# Patient Record
Sex: Male | Born: 1945 | Race: White | Hispanic: No | Smoking: Former smoker
Health system: Southern US, Community
[De-identification: ages and names within clinical notes are randomized; demographics above are authoritative.]

## PROBLEM LIST (undated history)

## (undated) DIAGNOSIS — N189 Chronic kidney disease, unspecified: Secondary | ICD-10-CM

## (undated) DIAGNOSIS — F419 Anxiety disorder, unspecified: Secondary | ICD-10-CM

## (undated) DIAGNOSIS — J449 Chronic obstructive pulmonary disease, unspecified: Secondary | ICD-10-CM

## (undated) DIAGNOSIS — I739 Peripheral vascular disease, unspecified: Secondary | ICD-10-CM

## (undated) DIAGNOSIS — E46 Unspecified protein-calorie malnutrition: Secondary | ICD-10-CM

## (undated) DIAGNOSIS — I809 Phlebitis and thrombophlebitis of unspecified site: Secondary | ICD-10-CM

## (undated) DIAGNOSIS — I10 Essential (primary) hypertension: Secondary | ICD-10-CM

## (undated) DIAGNOSIS — C801 Malignant (primary) neoplasm, unspecified: Secondary | ICD-10-CM

## (undated) DIAGNOSIS — E785 Hyperlipidemia, unspecified: Secondary | ICD-10-CM

## (undated) DIAGNOSIS — I350 Nonrheumatic aortic (valve) stenosis: Secondary | ICD-10-CM

## (undated) DIAGNOSIS — F99 Mental disorder, not otherwise specified: Secondary | ICD-10-CM

## (undated) HISTORY — PX: THYROIDECTOMY: SHX17

## (undated) HISTORY — PX: OTHER SURGICAL HISTORY: SHX169

## (undated) HISTORY — PX: TOTAL LARYNGECTOMY: SHX2543

## (undated) HISTORY — PX: HEMORROIDECTOMY: SUR656

## (undated) HISTORY — PX: LYMPH NODE BIOPSY: SHX201

---

## 2012-09-04 HISTORY — PX: NECK DISSECTION: SUR422

## 2012-09-21 ENCOUNTER — Inpatient Hospital Stay
Admission: AD | Admit: 2012-09-21 | Discharge: 2012-09-27 | Disposition: A | Discharge status: Other Acute Inpatient Hospital | Payer: Medicare Other | Source: Ambulatory Visit | Attending: Internal Medicine | Admitting: Internal Medicine

## 2012-09-21 ENCOUNTER — Other Ambulatory Visit (HOSPITAL_COMMUNITY): Payer: Medicare Other

## 2012-09-21 DIAGNOSIS — J9501 Hemorrhage from tracheostomy stoma: Secondary | ICD-10-CM

## 2012-09-21 DIAGNOSIS — J9601 Acute respiratory failure with hypoxia: Secondary | ICD-10-CM

## 2012-09-21 DIAGNOSIS — J441 Chronic obstructive pulmonary disease with (acute) exacerbation: Secondary | ICD-10-CM

## 2012-09-21 DIAGNOSIS — Z930 Tracheostomy status: Secondary | ICD-10-CM

## 2012-09-21 DIAGNOSIS — J40 Bronchitis, not specified as acute or chronic: Secondary | ICD-10-CM

## 2012-09-21 DIAGNOSIS — I469 Cardiac arrest, cause unspecified: Secondary | ICD-10-CM

## 2012-09-21 MED ORDER — IOHEXOL 300 MG/ML  SOLN
50.0000 mL | Freq: Once | INTRAMUSCULAR | Status: AC | PRN
  Administered 2012-09-21: 50 mL via ORAL

## 2012-09-22 ENCOUNTER — Other Ambulatory Visit (HOSPITAL_COMMUNITY): Payer: Medicare Other

## 2012-09-22 LAB — COMPREHENSIVE METABOLIC PANEL
ALT: 20 U/L (ref 0–53)
Alkaline Phosphatase: 48 U/L (ref 39–117)
Creatinine, Ser: 0.78 mg/dL (ref 0.50–1.35)
GFR calc Af Amer: >90 mL/min (ref >90)
GFR calc non Af Amer: >90 mL/min (ref >90)
Total Bilirubin: 0.4 mg/dL (ref 0.3–1.2)
Total Protein: 5.1 g/dL — ABNORMAL LOW (ref 6.0–8.3)

## 2012-09-22 LAB — CBC
MCH: 31.1 pg (ref 26.0–34.0)
MCV: 91.2 fL (ref 78.0–100.0)
Platelets: 253 10*3/uL (ref 150–400)
RDW: 15 % (ref 11.5–15.5)

## 2012-09-22 LAB — MAGNESIUM: Magnesium: 1.8 mg/dL (ref 1.5–2.5)

## 2012-09-23 LAB — BLOOD GAS, ARTERIAL
Bicarbonate: 23.6 mEq/L (ref 20.0–24.0)
O2 Saturation: 94.5 %
Patient temperature: 98.6
pH, Arterial: 7.495 — ABNORMAL HIGH (ref 7.350–7.450)

## 2012-09-24 DIAGNOSIS — J441 Chronic obstructive pulmonary disease with (acute) exacerbation: Secondary | ICD-10-CM

## 2012-09-24 DIAGNOSIS — J40 Bronchitis, not specified as acute or chronic: Secondary | ICD-10-CM

## 2012-09-24 DIAGNOSIS — Z930 Tracheostomy status: Secondary | ICD-10-CM

## 2012-09-24 NOTE — Consult Note (Signed)
PULMONARY  / CRITICAL CARE MEDICINE  Name: Jason Branch MRN: [id number] DOB: 06/15/1945    ADMISSION DATE:  09/21/2012 CONSULTATION DATE:  09/24/2012  REFERRING MD :  Annabelle Harman PRIMARY SERVICE:  [facility]  CHIEF COMPLAINT:  Tracheostomy management.  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  Patient arrived with a nasal trumpet in his stoma.  PCCM called to evaluate airway.  SIGNIFICANT EVENTS / STUDIES:  Laryngectomy recently.  LINES / TUBES: Trach stoma with a nasal trumpet within it.  CULTURES: Sputum 8/25>>>  ANTIBIOTICS: Cefepime 8/25>>> Vancomycin 8/25>>>  PAST MEDICAL HISTORY :  No past medical history on file. reviewed No past surgical history on file. reviewed Prior to Admission medications   Not on File  Reviewed Allergies not on file Reviewed FAMILY HISTORY:  No family history on file.Reviewed SOCIAL HISTORY:  has no tobacco, alcohol, and drug history on file.reviewed.  REVIEW OF SYSTEMS:   Trached, unable to answer questions.  SUBJECTIVE: Comfortable.  VITAL SIGNS:  Reviewed.  PHYSICAL EXAMINATION: General:  Chronically ill appearing male with large stoma in the anterior neck and significant foul smelling tracheal secretion from stoma. Neuro:  Alert and interactive. HEENT:  [place]/AT, PERRL, EOM-I and MMM. Neck:  Supple, -LAN, -JVD and stoma noted with increased secretion. Cardiovascular:  RRR, Nl S1/S2, -M/R/G. Lungs:  Coarse BS diffusely. Abdomen:  Soft, NT, ND and +BS. Musculoskeletal:  -edema and -tenderness. Skin:  Thin but intact.  Recent Labs Lab 09/22/12 0535  NA 137  K 3.5  CL 104  CO2 26  BUN 11  CREATININE 0.78  GLUCOSE 114*   Recent Labs Lab 09/22/12 0535  HGB 11.7*  HCT 34.3*  WBC 6.8  PLT 253   No results found.  ASSESSMENT / PLAN:  67 year old male s/p laryngectomy with a large stoma with a nasal trumpet rising from it trach site.   OR notes were reviewed, there is a mention of a "voice box" attached.  The patient is on TC with purulent secretion from site.  Purulent bronchitis. Tracheostomy as noted above. COPD.  Recommendations: - Cultures. - Vanc/cefepime as ordered. - Bronchodilator. - Recommend consult with ENT to evaluate the airway since patient will need a more permanent solution to tracheostomy. - Will follow up with you.  Alyson Reedy, M.D. Pulmonary and Critical Care Medicine [facility] Pager: [phone]  09/24/2012, 3:39 PM

## 2012-09-25 ENCOUNTER — Other Ambulatory Visit (HOSPITAL_COMMUNITY): Payer: Medicare Other

## 2012-09-25 LAB — CULTURE, RESPIRATORY W GRAM STAIN

## 2012-09-25 MED ORDER — IOHEXOL 300 MG/ML  SOLN
80.0000 mL | Freq: Once | INTRAMUSCULAR | Status: AC | PRN
  Administered 2012-09-25: 80 mL via INTRAVENOUS

## 2012-09-26 DIAGNOSIS — J441 Chronic obstructive pulmonary disease with (acute) exacerbation: Secondary | ICD-10-CM

## 2012-09-26 DIAGNOSIS — Z930 Tracheostomy status: Secondary | ICD-10-CM

## 2012-09-26 DIAGNOSIS — J40 Bronchitis, not specified as acute or chronic: Secondary | ICD-10-CM

## 2012-09-26 NOTE — Progress Notes (Signed)
PULMONARY  / CRITICAL CARE MEDICINE  Name: Jason Branch MRN: [id number] DOB: 25-Sep-1945    ADMISSION DATE:  09/21/2012 CONSULTATION DATE:  09/24/2012  REFERRING MD :  Annabelle Harman PRIMARY SERVICE:  [facility]  CHIEF COMPLAINT:  Tracheostomy management.  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  Patient arrived with a nasal trumpet in his stoma.  PCCM called to evaluate airway.  SIGNIFICANT EVENTS / STUDIES:  Laryngectomy recently.  LINES / TUBES: Trach stoma with a nasal trumpet within it.  CULTURES: Sputum 8/25>>>Psuedomonas in sputum, sensitive to cefepime.  ANTIBIOTICS: Cefepime 8/25>>> Vancomycin 8/25>>>  SUBJECTIVE: Comfortable.  VITAL SIGNS:  Reviewed.  PHYSICAL EXAMINATION: General:  Chronically ill appearing male with large stoma in the anterior neck and significant foul smelling tracheal secretion from stoma. Neuro:  Alert and interactive. HEENT:  [place]/AT, PERRL, EOM-I and MMM. Neck:  Supple, -LAN, -JVD and stoma noted with increased secretion. Cardiovascular:  RRR, Nl S1/S2, -M/R/G. Lungs:  Coarse BS diffusely. Abdomen:  Soft, NT, ND and +BS. Musculoskeletal:  -edema and -tenderness. Skin:  Thin but intact.  Recent Labs Lab 09/22/12 0535  NA 137  K 3.5  CL 104  CO2 26  BUN 11  CREATININE 0.78  GLUCOSE 114*    Recent Labs Lab 09/22/12 0535  HGB 11.7*  HCT 34.3*  WBC 6.8  PLT 253   Ct Soft Tissue Neck W Contrast  09/26/2012   *RADIOLOGY REPORT*  Clinical Data: Evaluate for abscess.  History of tracheostomy.  CT NECK WITH CONTRAST  Technique:  Multidetector CT imaging of the neck was performed with intravenous contrast.  Contrast: 80mL OMNIPAQUE IOHEXOL 300 MG/ML  SOLN  Comparison: None.  Findings: A tracheostomy has been performed.  It is widely patent. A nasal trumpet is in the stoma anteriorly.  There is mild surrounding subcutaneous air which extends into the  prevertebral soft tissues. The patient appears to have undergone total laryngectomy.  There is no visible laryngeal skeleton remaining. The hyoid bone is present.  There is a large soft tissue mass in the infrahyoid space to the left of midline measuring 70 x 43 x 67 mm (image 58 series 2 for instance). It is hard to imagine that this represents tumor given that there has been laryngectomy.  This is more likely to represent a hematoma or phlegmon.  Mild surrounding cellulitic change.  No central liquefaction to definitely suggest abscess.  If there is concern for infected hematoma, tissue sampling could be performed at any of multiple levels.  IMPRESSION: Large soft tissue mass to the left of midline in the infrahyoid space measuring 70 x 43 x 67 mm.  In this patient with tracheostomy and total laryngectomy, this likely represents a hematoma or phlegmon.  Residual tumor seems unlikely.  No central liquefaction to suggest abscess.  Tracheostomy with nasal trumpet in the stoma.  No evidence for airway obstruction.   Original Report Authenticated By: Davonna Belling, M.D.    ASSESSMENT / PLAN:  67 year old male s/p laryngectomy with a large stoma with a nasal trumpet rising from it trach site.  OR notes were reviewed, there is a mention of a "voice box" attached.  The patient is on TC with purulent secretion from site.  Purulent bronchitis with pseudomonas. Tracheostomy as noted above. COPD.  Recommendations: - Cultures noted as above, agree with cefepime. - Vanc/cefepime as ordered. - Bronchodilator. - ENT evaluation pending, neck CT ordered and pending. -  Will follow up with you.  Alyson Reedy, M.D. Pulmonary and Critical Care Medicine [facility] Pager: [phone]  09/26/2012, 11:32 AM

## 2012-09-27 ENCOUNTER — Encounter (HOSPITAL_COMMUNITY): Payer: Self-pay | Admitting: Anesthesiology

## 2012-09-27 ENCOUNTER — Encounter (HOSPITAL_COMMUNITY): Payer: Self-pay | Admitting: General Practice

## 2012-09-27 ENCOUNTER — Inpatient Hospital Stay (HOSPITAL_COMMUNITY)
Admission: AD | Admit: 2012-09-27 | Discharge: 2012-09-28 | DRG: 252 | Disposition: A | Discharge status: Other Acute Inpatient Hospital | Payer: Medicare Other | Source: Other Acute Inpatient Hospital | Attending: Pulmonary Disease | Admitting: Pulmonary Disease

## 2012-09-27 ENCOUNTER — Encounter (HOSPITAL_COMMUNITY)
Admission: AD | Disposition: A | Discharge status: Other Acute Inpatient Hospital | Payer: Self-pay | Source: Ambulatory Visit | Attending: Internal Medicine

## 2012-09-27 ENCOUNTER — Ambulatory Visit (HOSPITAL_COMMUNITY): Payer: Medicare Other | Admitting: Anesthesiology

## 2012-09-27 DIAGNOSIS — J9501 Hemorrhage from tracheostomy stoma: Secondary | ICD-10-CM

## 2012-09-27 DIAGNOSIS — J9600 Acute respiratory failure, unspecified whether with hypoxia or hypercapnia: Secondary | ICD-10-CM | POA: Diagnosis present

## 2012-09-27 DIAGNOSIS — Z8521 Personal history of malignant neoplasm of larynx: Secondary | ICD-10-CM

## 2012-09-27 DIAGNOSIS — R7309 Other abnormal glucose: Secondary | ICD-10-CM | POA: Diagnosis present

## 2012-09-27 DIAGNOSIS — I469 Cardiac arrest, cause unspecified: Secondary | ICD-10-CM | POA: Diagnosis present

## 2012-09-27 DIAGNOSIS — D62 Acute posthemorrhagic anemia: Secondary | ICD-10-CM | POA: Diagnosis present

## 2012-09-27 DIAGNOSIS — J40 Bronchitis, not specified as acute or chronic: Secondary | ICD-10-CM

## 2012-09-27 DIAGNOSIS — E039 Hypothyroidism, unspecified: Secondary | ICD-10-CM | POA: Diagnosis present

## 2012-09-27 DIAGNOSIS — J690 Pneumonitis due to inhalation of food and vomit: Secondary | ICD-10-CM | POA: Diagnosis present

## 2012-09-27 DIAGNOSIS — Z9089 Acquired absence of other organs: Secondary | ICD-10-CM

## 2012-09-27 DIAGNOSIS — J9601 Acute respiratory failure with hypoxia: Secondary | ICD-10-CM

## 2012-09-27 DIAGNOSIS — G9340 Encephalopathy, unspecified: Secondary | ICD-10-CM | POA: Diagnosis present

## 2012-09-27 DIAGNOSIS — Z79899 Other long term (current) drug therapy: Secondary | ICD-10-CM

## 2012-09-27 DIAGNOSIS — IMO0002 Reserved for concepts with insufficient information to code with codable children: Secondary | ICD-10-CM

## 2012-09-27 DIAGNOSIS — J441 Chronic obstructive pulmonary disease with (acute) exacerbation: Secondary | ICD-10-CM

## 2012-09-27 DIAGNOSIS — R58 Hemorrhage, not elsewhere classified: Secondary | ICD-10-CM | POA: Diagnosis present

## 2012-09-27 DIAGNOSIS — E41 Nutritional marasmus: Secondary | ICD-10-CM | POA: Diagnosis present

## 2012-09-27 DIAGNOSIS — I772 Rupture of artery: Secondary | ICD-10-CM | POA: Diagnosis present

## 2012-09-27 DIAGNOSIS — I10 Essential (primary) hypertension: Secondary | ICD-10-CM | POA: Diagnosis present

## 2012-09-27 DIAGNOSIS — J9509 Other tracheostomy complication: Secondary | ICD-10-CM

## 2012-09-27 DIAGNOSIS — E872 Acidosis, unspecified: Secondary | ICD-10-CM | POA: Diagnosis present

## 2012-09-27 DIAGNOSIS — E785 Hyperlipidemia, unspecified: Secondary | ICD-10-CM | POA: Diagnosis present

## 2012-09-27 DIAGNOSIS — Z930 Tracheostomy status: Secondary | ICD-10-CM

## 2012-09-27 HISTORY — DX: Mental disorder, not otherwise specified: F99

## 2012-09-27 HISTORY — DX: Hyperlipidemia, unspecified: E78.5

## 2012-09-27 HISTORY — DX: Anxiety disorder, unspecified: F41.9

## 2012-09-27 HISTORY — DX: Unspecified protein-calorie malnutrition: E46

## 2012-09-27 HISTORY — DX: Chronic kidney disease, unspecified: N18.9

## 2012-09-27 HISTORY — PX: TRACHEOSTOMY TUBE PLACEMENT: SHX814

## 2012-09-27 HISTORY — DX: Malignant (primary) neoplasm, unspecified: C80.1

## 2012-09-27 HISTORY — DX: Essential (primary) hypertension: I10

## 2012-09-27 HISTORY — DX: Nonrheumatic aortic (valve) stenosis: I35.0

## 2012-09-27 HISTORY — DX: Chronic obstructive pulmonary disease, unspecified: J44.9

## 2012-09-27 HISTORY — DX: Phlebitis and thrombophlebitis of unspecified site: I80.9

## 2012-09-27 HISTORY — DX: Peripheral vascular disease, unspecified: I73.9

## 2012-09-27 LAB — URINE MICROSCOPIC-ADD ON

## 2012-09-27 LAB — TROPONIN I
Troponin I: <0.3 ng/mL (ref <0.30)
Troponin I: <0.3 ng/mL (ref <0.30)

## 2012-09-27 LAB — URINALYSIS, ROUTINE W REFLEX MICROSCOPIC
Glucose, UA: NEGATIVE mg/dL
Hgb urine dipstick: NEGATIVE
Specific Gravity, Urine: 1.028 (ref 1.005–1.030)
Urobilinogen, UA: 1 mg/dL (ref 0.0–1.0)
pH: 7.5 (ref 5.0–8.0)

## 2012-09-27 LAB — COMPREHENSIVE METABOLIC PANEL
ALT: 68 U/L — ABNORMAL HIGH (ref 0–53)
AST: 38 U/L — ABNORMAL HIGH (ref 0–37)
Albumin: 2.2 g/dL — ABNORMAL LOW (ref 3.5–5.2)
Alkaline Phosphatase: 66 U/L (ref 39–117)
CO2: 24 mEq/L (ref 19–32)
CO2: 21 mEq/L (ref 19–32)
Chloride: 102 mEq/L (ref 96–112)
Chloride: 106 mEq/L (ref 96–112)
GFR calc Af Amer: >90 mL/min (ref >90)
GFR calc Af Amer: >90 mL/min (ref >90)
GFR calc non Af Amer: 88 mL/min — ABNORMAL LOW (ref >90)
Glucose, Bld: 176 mg/dL — ABNORMAL HIGH (ref 70–99)
Potassium: 4.8 mEq/L (ref 3.5–5.1)
Sodium: 136 mEq/L (ref 135–145)
Sodium: 137 mEq/L (ref 135–145)
Total Bilirubin: 0.6 mg/dL (ref 0.3–1.2)
Total Protein: 5.9 g/dL — ABNORMAL LOW (ref 6.0–8.3)

## 2012-09-27 LAB — CBC WITH DIFFERENTIAL/PLATELET
Basophils Relative: 0 % (ref 0–1)
Eosinophils Relative: 0 % (ref 0–5)
HCT: 34.9 % — ABNORMAL LOW (ref 39.0–52.0)
Hemoglobin: 11.8 g/dL — ABNORMAL LOW (ref 13.0–17.0)
Hemoglobin: 12.6 g/dL — ABNORMAL LOW (ref 13.0–17.0)
Lymphocytes Relative: 13 % (ref 12–46)
Lymphocytes Relative: 2 % — ABNORMAL LOW (ref 12–46)
Lymphs Abs: 2 10*3/uL (ref 0.7–4.0)
MCHC: 33.8 g/dL (ref 30.0–36.0)
Monocytes Absolute: 1.4 10*3/uL — ABNORMAL HIGH (ref 0.1–1.0)
Monocytes Absolute: 1.3 10*3/uL — ABNORMAL HIGH (ref 0.1–1.0)
Monocytes Relative: 9 % (ref 3–12)
Monocytes Relative: 4 % (ref 3–12)
Neutro Abs: 11.4 10*3/uL — ABNORMAL HIGH (ref 1.7–7.7)
Neutrophils Relative %: 94 % — ABNORMAL HIGH (ref 43–77)
Platelets: 293 10*3/uL (ref 150–400)
RBC: 4.03 MIL/uL — ABNORMAL LOW (ref 4.22–5.81)
WBC Morphology: INCREASED
WBC: 33.1 10*3/uL — ABNORMAL HIGH (ref 4.0–10.5)

## 2012-09-27 LAB — POCT I-STAT 7, (LYTES, BLD GAS, ICA,H+H)
Calcium, Ion: 1.04 mmol/L — ABNORMAL LOW (ref 1.13–1.30)
Hemoglobin: 8.2 g/dL — ABNORMAL LOW (ref 13.0–17.0)
O2 Saturation: 98 %
Patient temperature: 36.1
Potassium: 3.6 mEq/L (ref 3.5–5.1)
Potassium: 3.8 mEq/L (ref 3.5–5.1)
Sodium: 142 mEq/L (ref 135–145)
TCO2: 25 mmol/L (ref 0–100)
TCO2: 23 mmol/L (ref 0–100)
pCO2 arterial: 59.8 mmHg (ref 35.0–45.0)
pH, Arterial: 7.192 — CL (ref 7.350–7.450)
pH, Arterial: 7.293 — ABNORMAL LOW (ref 7.350–7.450)

## 2012-09-27 LAB — CBC
HCT: 32.9 % — ABNORMAL LOW (ref 39.0–52.0)
MCHC: 34.7 g/dL (ref 30.0–36.0)
MCV: 90.1 fL (ref 78.0–100.0)
RDW: 15.9 % — ABNORMAL HIGH (ref 11.5–15.5)

## 2012-09-27 LAB — GLUCOSE, CAPILLARY: Glucose-Capillary: 166 mg/dL — ABNORMAL HIGH (ref 70–99)

## 2012-09-27 LAB — POCT I-STAT 3, ART BLOOD GAS (G3+)
Patient temperature: 97.3
TCO2: 22 mmol/L (ref 0–100)
pH, Arterial: 7.421 (ref 7.350–7.450)

## 2012-09-27 LAB — LACTIC ACID, PLASMA
Lactic Acid, Venous: 1.2 mmol/L (ref 0.5–2.2)
Lactic Acid, Venous: 1.5 mmol/L (ref 0.5–2.2)

## 2012-09-27 LAB — BLOOD GAS, ARTERIAL
Bicarbonate: 20.3 mEq/L (ref 20.0–24.0)
O2 Saturation: 98.5 %
pH, Arterial: 7.092 — CL (ref 7.350–7.450)
pO2, Arterial: 180 mmHg — ABNORMAL HIGH (ref 80.0–100.0)

## 2012-09-27 LAB — APTT: aPTT: 30 seconds (ref 24–37)

## 2012-09-27 LAB — PREPARE RBC (CROSSMATCH)

## 2012-09-27 SURGERY — CREATION, TRACHEOSTOMY
Anesthesia: General | Site: Neck | Laterality: Right | Wound class: Dirty or Infected

## 2012-09-27 MED ORDER — BIOTENE DRY MOUTH MT LIQD
15.0000 mL | Freq: Four times a day (QID) | OROMUCOSAL | Status: DC
  Administered 2012-09-27 – 2012-09-28 (×5): 15 mL via OROMUCOSAL

## 2012-09-27 MED ORDER — RISAQUAD PO CAPS
1.0000 | ORAL_CAPSULE | Freq: Two times a day (BID) | ORAL | Status: DC
  Administered 2012-09-27 – 2012-09-28 (×2): 1 via ORAL
  Filled 2012-09-27 (×4): qty 1

## 2012-09-27 MED ORDER — ONDANSETRON HCL 4 MG/2ML IJ SOLN
4.0000 mg | Freq: Three times a day (TID) | INTRAMUSCULAR | Status: DC | PRN
  Administered 2012-09-28: 8 mg via INTRAVENOUS
  Filled 2012-09-27: qty 4

## 2012-09-27 MED ORDER — DEXTROSE 5 % IV SOLN
1.0000 g | Freq: Once | INTRAVENOUS | Status: AC
  Administered 2012-09-27: 1 g via INTRAVENOUS
  Filled 2012-09-27: qty 1

## 2012-09-27 MED ORDER — MIDAZOLAM HCL 5 MG/5ML IJ SOLN
INTRAMUSCULAR | Status: DC | PRN
  Administered 2012-09-27 (×2): 1 mg via INTRAVENOUS

## 2012-09-27 MED ORDER — ISOSORBIDE DINITRATE 20 MG PO TABS
20.0000 mg | ORAL_TABLET | Freq: Two times a day (BID) | ORAL | Status: DC
  Filled 2012-09-27 (×2): qty 1

## 2012-09-27 MED ORDER — PANTOPRAZOLE SODIUM 40 MG IV SOLR
40.0000 mg | INTRAVENOUS | Status: DC
  Administered 2012-09-27: 40 mg via INTRAVENOUS
  Filled 2012-09-27 (×2): qty 40

## 2012-09-27 MED ORDER — ATORVASTATIN CALCIUM 40 MG PO TABS
40.0000 mg | ORAL_TABLET | Freq: Every day | ORAL | Status: DC
  Administered 2012-09-27: 40 mg via ORAL
  Filled 2012-09-27 (×2): qty 1

## 2012-09-27 MED ORDER — LACTATED RINGERS IV SOLN
INTRAVENOUS | Status: DC | PRN
  Administered 2012-09-27: 06:00:00 via INTRAVENOUS

## 2012-09-27 MED ORDER — SODIUM CHLORIDE 0.9 % IV SOLN
10.0000 mg | INTRAVENOUS | Status: DC | PRN
  Administered 2012-09-27: 50 ug/min via INTRAVENOUS

## 2012-09-27 MED ORDER — LACTATED RINGERS IV SOLN
INTRAVENOUS | Status: DC | PRN
  Administered 2012-09-27: 07:00:00 via INTRAVENOUS

## 2012-09-27 MED ORDER — CEFAZOLIN SODIUM 1-5 GM-% IV SOLN
INTRAVENOUS | Status: AC
  Filled 2012-09-27: qty 50

## 2012-09-27 MED ORDER — PIPERACILLIN-TAZOBACTAM 3.375 G IVPB
3.3750 g | Freq: Three times a day (TID) | INTRAVENOUS | Status: DC
  Filled 2012-09-27 (×2): qty 50

## 2012-09-27 MED ORDER — CHLORHEXIDINE GLUCONATE 0.12 % MT SOLN: 15.0000 mL | Freq: Two times a day (BID) | OROMUCOSAL | Status: DC

## 2012-09-27 MED ORDER — PNEUMOCOCCAL VAC POLYVALENT 25 MCG/0.5ML IJ INJ
0.5000 mL | INJECTION | INTRAMUSCULAR | Status: AC
  Administered 2012-09-28: 0.5 mL via INTRAMUSCULAR
  Filled 2012-09-27: qty 0.5

## 2012-09-27 MED ORDER — SODIUM PHOSPHATE 3 MMOLE/ML IV SOLN
10.0000 mmol | Freq: Once | INTRAVENOUS | Status: AC
  Administered 2012-09-27: 10 mmol via INTRAVENOUS
  Filled 2012-09-27: qty 3.33

## 2012-09-27 MED ORDER — EZETIMIBE 10 MG PO TABS
10.0000 mg | ORAL_TABLET | Freq: Every day | ORAL | Status: DC
  Administered 2012-09-27: 10 mg via ORAL
  Filled 2012-09-27 (×2): qty 1

## 2012-09-27 MED ORDER — INSULIN ASPART 100 UNIT/ML ~~LOC~~ SOLN
0.0000 [IU] | SUBCUTANEOUS | Status: DC
  Administered 2012-09-27 – 2012-09-28 (×2): 2 [IU] via SUBCUTANEOUS

## 2012-09-27 MED ORDER — 0.9 % SODIUM CHLORIDE (POUR BTL) OPTIME
TOPICAL | Status: DC | PRN
  Administered 2012-09-27: 1000 mL

## 2012-09-27 MED ORDER — LEVOTHYROXINE SODIUM 125 MCG PO TABS
125.0000 ug | ORAL_TABLET | Freq: Every day | ORAL | Status: DC
  Administered 2012-09-27 – 2012-09-28 (×2): 125 ug via ORAL
  Filled 2012-09-27 (×3): qty 1

## 2012-09-27 MED ORDER — VITAMIN C 500 MG PO TABS
2000.0000 mg | ORAL_TABLET | Freq: Every day | ORAL | Status: DC
  Administered 2012-09-27 – 2012-09-28 (×2): 2000 mg via ORAL
  Filled 2012-09-27 (×3): qty 4

## 2012-09-27 MED ORDER — PANTOPRAZOLE SODIUM 40 MG PO TBEC
40.0000 mg | DELAYED_RELEASE_TABLET | Freq: Every day | ORAL | Status: DC
  Administered 2012-09-27: 40 mg via ORAL
  Filled 2012-09-27: qty 1

## 2012-09-27 MED ORDER — PIPERACILLIN-TAZOBACTAM 3.375 G IVPB
3.3750 g | Freq: Three times a day (TID) | INTRAVENOUS | Status: DC
  Administered 2012-09-27 – 2012-09-28 (×3): 3.375 g via INTRAVENOUS
  Filled 2012-09-27 (×5): qty 50

## 2012-09-27 MED ORDER — BUPROPION HCL 75 MG PO TABS
75.0000 mg | ORAL_TABLET | Freq: Three times a day (TID) | ORAL | Status: DC
  Administered 2012-09-27 – 2012-09-28 (×3): 75 mg via ORAL
  Filled 2012-09-27 (×6): qty 1

## 2012-09-27 MED ORDER — BIOTENE DRY MOUTH MT LIQD: 1.0000 "application " | Freq: Four times a day (QID) | OROMUCOSAL | Status: DC

## 2012-09-27 MED ORDER — ZINC SULFATE 220 (50 ZN) MG PO CAPS
220.0000 mg | ORAL_CAPSULE | Freq: Every day | ORAL | Status: DC
  Administered 2012-09-27 – 2012-09-28 (×2): 220 mg via ORAL
  Filled 2012-09-27 (×3): qty 1

## 2012-09-27 MED ORDER — DEXTROSE 5 % IV SOLN
1.0000 g | Freq: Three times a day (TID) | INTRAVENOUS | Status: DC
  Filled 2012-09-27 (×2): qty 1

## 2012-09-27 MED ORDER — SODIUM CHLORIDE 0.9 % IV SOLN
INTRAVENOUS | Status: DC | PRN
  Administered 2012-09-27: 07:00:00 via INTRAVENOUS

## 2012-09-27 MED ORDER — ARTIFICIAL TEARS OP OINT
TOPICAL_OINTMENT | OPHTHALMIC | Status: DC | PRN
  Administered 2012-09-27: 1 via OPHTHALMIC

## 2012-09-27 MED ORDER — MODAFINIL 200 MG PO TABS
100.0000 mg | ORAL_TABLET | Freq: Every day | ORAL | Status: DC
  Administered 2012-09-27: 100 mg via ORAL
  Filled 2012-09-27 (×2): qty 1

## 2012-09-27 MED ORDER — ROCURONIUM BROMIDE 100 MG/10ML IV SOLN
INTRAVENOUS | Status: DC | PRN
  Administered 2012-09-27 (×2): 50 mg via INTRAVENOUS

## 2012-09-27 MED ORDER — METRONIDAZOLE 500 MG PO TABS
500.0000 mg | ORAL_TABLET | Freq: Three times a day (TID) | ORAL | Status: DC
  Administered 2012-09-27: 500 mg via ORAL
  Filled 2012-09-27 (×3): qty 1

## 2012-09-27 MED ORDER — MAGNESIUM SULFATE 40 MG/ML IJ SOLN
2.0000 g | Freq: Once | INTRAMUSCULAR | Status: AC
  Administered 2012-09-28: 2 g via INTRAVENOUS
  Filled 2012-09-27: qty 50

## 2012-09-27 MED ORDER — FENTANYL CITRATE 0.05 MG/ML IJ SOLN
25.0000 ug | INTRAMUSCULAR | Status: DC | PRN
  Administered 2012-09-28 (×3): 100 ug via INTRAVENOUS
  Filled 2012-09-27 (×3): qty 2

## 2012-09-27 MED ORDER — FENTANYL CITRATE 0.05 MG/ML IJ SOLN
INTRAMUSCULAR | Status: DC | PRN
  Administered 2012-09-27 (×2): 50 ug via INTRAVENOUS

## 2012-09-27 MED ORDER — LISINOPRIL 5 MG PO TABS
5.0000 mg | ORAL_TABLET | Freq: Every day | ORAL | Status: DC
Start: 2012-09-27 — End: 2012-09-27
  Administered 2012-09-27: 5 mg via ORAL
  Filled 2012-09-27: qty 1

## 2012-09-27 MED ORDER — SODIUM CHLORIDE 0.9 % IV SOLN
INTRAVENOUS | Status: DC
  Administered 2012-09-27 – 2012-09-28 (×2): via INTRAVENOUS

## 2012-09-27 MED ORDER — SODIUM CHLORIDE 0.9 % IV SOLN
INTRAVENOUS | Status: DC
  Administered 2012-09-27: 10:00:00 via INTRAVENOUS

## 2012-09-27 MED ORDER — CEFAZOLIN SODIUM-DEXTROSE 2-3 GM-% IV SOLR
INTRAVENOUS | Status: DC | PRN
  Administered 2012-09-27: 2 g via INTRAVENOUS

## 2012-09-27 MED ORDER — ATENOLOL 25 MG PO TABS
25.0000 mg | ORAL_TABLET | Freq: Every day | ORAL | Status: DC
  Administered 2012-09-27: 25 mg via ORAL
  Filled 2012-09-27: qty 1

## 2012-09-27 MED ORDER — CHLORHEXIDINE GLUCONATE 0.12 % MT SOLN
15.0000 mL | Freq: Two times a day (BID) | OROMUCOSAL | Status: DC
  Administered 2012-09-27 – 2012-09-28 (×3): 15 mL via OROMUCOSAL
  Filled 2012-09-27 (×2): qty 15

## 2012-09-27 MED FILL — Medication: Qty: 1 | Status: AC

## 2012-09-27 SURGICAL SUPPLY — 35 items
BLADE SURG ROTATE 9660 (MISCELLANEOUS) IMPLANT
CANISTER SUCTION 2500CC (MISCELLANEOUS) ×2 IMPLANT
CLEANER TIP ELECTROSURG 2X2 (MISCELLANEOUS) ×2 IMPLANT
CLOTH BEACON ORANGE TIMEOUT ST (SAFETY) ×2 IMPLANT
COVER SURGICAL LIGHT HANDLE (MISCELLANEOUS) ×2 IMPLANT
CRADLE DONUT ADULT HEAD (MISCELLANEOUS) IMPLANT
DECANTER SPIKE VIAL GLASS SM (MISCELLANEOUS) ×2 IMPLANT
ELECT COATED BLADE 2.86 ST (ELECTRODE) ×2 IMPLANT
ELECT REM PT RETURN 9FT ADLT (ELECTROSURGICAL) ×2
ELECTRODE REM PT RTRN 9FT ADLT (ELECTROSURGICAL) ×1 IMPLANT
GAUZE PACKING IODOFORM 1/2 (PACKING) ×2 IMPLANT
GLOVE BIO SURGEON STRL SZ7.5 (GLOVE) ×2 IMPLANT
GLOVE BIOGEL PI IND STRL 6.5 (GLOVE) ×1 IMPLANT
GLOVE BIOGEL PI INDICATOR 6.5 (GLOVE) ×1
GLOVE ECLIPSE 8.0 STRL XLNG CF (GLOVE) ×4 IMPLANT
GLOVE SURG SS PI 6.0 STRL IVOR (GLOVE) ×2 IMPLANT
GLOVE SURG SS PI 6.5 STRL IVOR (GLOVE) ×2 IMPLANT
GOWN PREVENTION PLUS XLARGE (GOWN DISPOSABLE) ×2 IMPLANT
GOWN STRL NON-REIN LRG LVL3 (GOWN DISPOSABLE) ×8 IMPLANT
KIT BASIN OR (CUSTOM PROCEDURE TRAY) ×2 IMPLANT
KIT ROOM TURNOVER OR (KITS) ×2 IMPLANT
NS IRRIG 1000ML POUR BTL (IV SOLUTION) ×2 IMPLANT
PAD ARMBOARD 7.5X6 YLW CONV (MISCELLANEOUS) ×4 IMPLANT
PENCIL BUTTON HOLSTER BLD 10FT (ELECTRODE) ×2 IMPLANT
SPONGE DRAIN TRACH 4X4 STRL 2S (GAUZE/BANDAGES/DRESSINGS) ×2 IMPLANT
SUT CHROMIC 2 0 SH (SUTURE) ×2 IMPLANT
SUT ETHILON 2 0 FS 18 (SUTURE) IMPLANT
SUT SILK 2 0 SH (SUTURE) ×2 IMPLANT
SUT SILK 2 0 SH CR/8 (SUTURE) ×2 IMPLANT
SUT VIC AB 3-0 SH 18 (SUTURE) ×4 IMPLANT
TOWEL OR 17X24 6PK STRL BLUE (TOWEL DISPOSABLE) ×2 IMPLANT
TOWEL OR 17X26 10 PK STRL BLUE (TOWEL DISPOSABLE) ×2 IMPLANT
TRAY ENT MC OR (CUSTOM PROCEDURE TRAY) ×2 IMPLANT
TRAY FOLEY CATH 16FR SILVER (SET/KITS/TRAYS/PACK) ×2 IMPLANT
WATER STERILE IRR 1000ML POUR (IV SOLUTION) ×2 IMPLANT

## 2012-09-27 NOTE — Op Note (Signed)
09/27/2012  7:46 AM    Arman Filter  [id number]   Pre-Op Dx:  Wound hemorrhage  Post-op Dx: Right carotid rupture with exsanguinating hemorrhage  Proc: Right neck wound exploration with ligation, right common carotid artery   Surg:  Cephus Richer MD  Anes:  Gen. tracheostomal  EBL:  300 mL  Comp:  None  Findings:  1 cm rupture in the sidewall of the common carotid artery just above the wound breakdown. brisk retrograde bleeding.  Following ligation, strong carotid pulsation in the upper neck.  Large stomal wound breakdown with small pieces of necrotic cartilage at the tracheal end.  No signs of wound infection. Minimal wound granulation.  Procedure: The [facility] to [facility] area with massive hemorrhage from the neck. This was partially controlled with pressure. The patient was having difficulty ventilating adequately. Per critical care bronchoscopy, he had large clots down the left mainstem bronchus.  He actually coded in the holding area but was resuscitated with suction, ventilation, and fluids. When exploring the wound and evacuating clots, brisk bleeding ensued. He coughed up a very large clot from his chest and actually ventilated better following that. Before leaving the holding area, bronchoscopy revealed patent mainstem bronchi on both sides.  He was brought to the operating room with the bleeding temporally controlled by pressure.  Wound packing was removed and no bleeding was observed. He had been ventilated with an endotracheal tube. This was removed, and using a Yankauer suction, no significant clots were evacuated from the trachea or bronchi.  The endotracheal tube was replaced in the trachea stoma.  The transverse incision was opened for approximately 4 cm on the right side allowing access to a wound cavity.  Upon evacuating clots, once again very brisk bleeding was noted. Pressure inside the cavity was able to  control this and with observation and gentle dissection, it was decided that this was a carotid artery rupture with brisk retrograde flow. Ligation was deemed appropriate.  The carotid artery was dissected from eschar and fibrosis. It was isolated between hemostats. Palpation in the wound revealed good pulsation in the carotid above this level. The carotid was divided.  The lower stump was controlled with a 2-0 silk ligature. The upper stump was controlled  with a 2-0 silk ligature, and a 2-0 silk suture ligature. Hemostasis was observed.  The wound was thoroughly irrigated. Small bleeding from the wound edges was controlled with electrocautery. The wound is reapproximated with large bites of 3-0 Vicryl and then the skin was closed with staples. The cavity was loosely packed with 1/2 inch iodoform Nu Gauze. This was secured at the external skin with a single staple. The patient was extubated once again and tracheal suction was performed with no production of blood or clots. At this point the procedure was completed.   Dispo:   OR to 2100.    Plan:  He will be managed by critical care medicine.  When he is medically stabilized, [facility] He should not need any sort of endotracheal tube. He needs aggressive alimentation. We will remove the wound packing in 2 days. Check a TSH to make sure this is not impeding wound healing. Because he had very brisk bleeding retrograde, I do not anticipate neurologic compromise. We will use routine neuro checks.  Cephus Richer MD

## 2012-09-27 NOTE — Transfer of Care (Signed)
Immediate Anesthesia Transfer of Care Note  Patient: Jason Branch  Procedure(s) Performed: Procedure(s): RIGHT NECK EXPLORATION; WITH CAROTID LIGATION (Right)  Patient Location: ICU  Anesthesia Type:General  Level of Consciousness: sedated, unresponsive and Patient remains intubated per anesthesia plan  Airway & Oxygen Therapy: Patient remains intubated per anesthesia plan and Patient placed on Ventilator (see vital sign flow sheet for setting)  Post-op Assessment: Report given to PACU RN and Post -op Vital signs reviewed and stable  Post vital signs: Reviewed and stable  Complications: No apparent anesthesia complications

## 2012-09-27 NOTE — Progress Notes (Signed)
Episode of vomiting from trach site and mouth simultaneously. Patient mentation intact, able to follow commands, BP stable. Elink contacted for orders for NG tube. Per patient daughter advised in the past about placing GI tubes under IR.

## 2012-09-27 NOTE — Anesthesia Preprocedure Evaluation (Signed)
Anesthesia Evaluation  Patient identified by MRN, date of birth, ID band Patient awake    Reviewed: Allergy & Precautions, H&P , NPO status , Patient's Chart, lab work & pertinent test results, reviewed documented beta blocker date and time , Unable to perform ROS - Chart review only  Airway Mallampati: II TM Distance: >3 FB Neck ROM: full  Mouth opening: Limited Mouth Opening  Dental   Pulmonary COPD   + decreased breath sounds  + intubated    Cardiovascular negative cardio ROS  Rhythm:regular Rate:Tachycardia     Neuro/Psych negative neurological ROS  negative psych ROS   GI/Hepatic negative GI ROS, Neg liver ROS,   Endo/Other  negative endocrine ROS  Renal/GU negative Renal ROS  negative genitourinary   Musculoskeletal   Abdominal   Peds  Hematology negative hematology ROS (+)   Anesthesia Other Findings See surgeon's H&P   Reproductive/Obstetrics negative OB ROS                           Anesthesia Physical Anesthesia Plan  ASA: V and emergent  Anesthesia Plan: General   Post-op Pain Management:    Induction: Intravenous  Airway Management Planned: Tracheostomy  Additional Equipment:   Intra-op Plan:   Post-operative Plan: Post-operative intubation/ventilation  Informed Consent: I have reviewed the patients History and Physical, chart, labs and discussed the procedure including the risks, benefits and alternatives for the proposed anesthesia with the patient or authorized representative who has indicated his/her understanding and acceptance.   History available from chart only and Only emergency history available  Plan Discussed with: CRNA and Surgeon  Anesthesia Plan Comments: (Patient admitted from outside hospital. CPR in progress in holding area. Patient was resussitated and initial exam per surgeon. Emergently to OR with limited info available. CFrederick, MD)         Anesthesia Quick Evaluation

## 2012-09-27 NOTE — Progress Notes (Addendum)
eLink Physician-Brief Progress Note Patient Name: Jason Branch DOB: March 01, 1945 MRN: [id number]  Date of Service  09/27/2012   HPI/Events of Note   Vomiting with suctioning gastric contents from trach  eICU Interventions  D/w Dr Jenne Pane- place PEG to LIS Daughter states -may need fluoro - if NG required Dc lisinopril/aten/ isosorbide   Intervention Category Major Interventions: Other:  Jason Jerry V. 09/27/2012, 5:29 PM

## 2012-09-27 NOTE — Procedures (Signed)
Central Venous Catheter Insertion Procedure Note Jason Branch [id number] [birth year]  Procedure: Insertion of Central Venous Catheter Indications: cardiac arrest, hemorrhage  Procedure Details Consent: Unable to obtain consent because of emergent medical necessity. Time Out: not performed  I wore sterile  gloves, mask, gown, cap, but given the emergent nature of the situation the line was not placed under completely sterile conditions. Skin prep: brief chlorhexadine; no local anesthetic administered A antimicrobial bonded/coated triple lumen catheter was placed in the left femoral vein due to emergent situation using the Seldinger technique.   Evaluation Blood flow good Complications: No apparent complications Patient did tolerate procedure well. Chest X-ray ordered to verify placement.  CXR: not indicated.  Jason Branch 09/27/2012, 6:51 AM

## 2012-09-27 NOTE — Progress Notes (Signed)
CODE BLUE NOTE  Patient Name: Jason Branch   MRN: [id number]   Date of Birth/ Sex: 01/03/1946 , male      Admission Date: 09/27/2012  Attending Provider: Lonia Farber*  Primary Diagnosis: <principal problem not specified>    Indication: Pt was in his usual state of health until 4:50 AM, when he was noted to be desaturating on his pulse oximetry. The nurse reentered the room to find that he was hemorrhaging from the neck. Code blue was subsequently called. At the time of arrival on scene, ACLS protocol was underway. Still maintained a pulse and spontaneous breathing. CCM was present and directed the case and patient needed emergent surgery. Transported to OR, while in the holding room unsustained VT was noticed. Chest compressions were performed but patient was limited code and wanted no defibrillation. He was revived but then went into PEA. Resuscitated with fluid and went into ST.  ENT was on the case and took him to surgery.      Technical Description:  - CPR performance duration:  5  minutes  - Was defibrillation or cardioversion used? No   - Was external pacer placed? No  - Was patient intubated pre/post CPR? No    Medications Administered: Y = Yes; Blank = No Amiodarone    Atropine    Calcium    Epinephrine    Lidocaine    Magnesium    Norepinephrine  y  Phenylephrine    Sodium bicarbonate  y  Vasopressin      Post CPR evaluation:  - Final Status - Was patient successfully resuscitated ? Yes - What is current rhythm? ST  - What is current hemodynamic status? Stable    Miscellaneous Information:  - Labs sent, including: CBC, BMET, ABG, type and screen   - Primary team notified?  Yes  - Family Notified? No  - Additional notes/ transfer status: Post op         [name], MD  09/27/2012, 11:35 AM

## 2012-09-27 NOTE — Progress Notes (Signed)
ANTIBIOTIC CONSULT NOTE - INITIAL  Pharmacy Consult for Zosyn Indication: Pseudomonas pneumonia and foul drainage from neck wound (Narrowing to one drug)   Allergies  Allergen Reactions  . Morphine And Related Other (See Comments)    unknown  . Rosuvastatin Other (See Comments)    unknown  Patient Measurements: Height: 5\' 7"  (170.2 cm) Weight: 134 lb 14.7 oz (61.2 kg) IBW/kg (Calculated) : 66.1 Vital Signs: BP: 105/57 mmHg (08/28 1202) Pulse Rate: 83 (08/28 1202) Intake/Output from previous day: 08/27 0701 - 08/28 0700 In: 700 [Blood:700] Out: -  Intake/Output from this shift: Total I/O In: 1060 [I.V.:1060] Out: 360 [Urine:60; Blood:300] Labs:  Recent Labs  09/27/12 0500 09/27/12 0523 09/27/12 0649 09/27/12 0737 09/27/12 1130  WBC 15.1*  --   --   --  PENDING  HGB 11.8*  --  8.2* 11.2* 12.6*  PLT 384  --   --   --  PENDING  CREATININE  --  0.77  --   --   --    Estimated Creatinine Clearance: 78.6 ml/min (by C-G formula based on Cr of 0.77).  Microbiology: Recent Results (from the past 720 hour(s))  CULTURE, RESPIRATORY (NON-EXPECTORATED)     Status: None   Collection Time    09/22/12 12:48 PM      Result Value Range Status   Specimen Description TRACHEAL ASPIRATE   Final   Special Requests NONE   Final   Gram Stain     Final   Value: ABUNDANT WBC PRESENT, PREDOMINANTLY PMN     RARE SQUAMOUS EPITHELIAL CELLS PRESENT     FEW GRAM NEGATIVE RODS     FEW GRAM POSITIVE COCCI IN PAIRS     Performed at [facility]   Culture     Final   Value: MODERATE PSEUDOMONAS AERUGINOSA     Performed at [facility]   Report Status 09/25/2012 FINAL   Final   Organism ID, Bacteria PSEUDOMONAS AERUGINOSA   Final  MRSA PCR SCREENING     Status: None   Collection Time    09/27/12  8:41 AM      Result Value Range Status   MRSA by PCR NEGATIVE  NEGATIVE Final   Comment:            The GeneXpert MRSA Assay (FDA     approved for NASAL specimens     only), is  one component of a     comprehensive MRSA colonization     surveillance program. It is not     intended to diagnose MRSA     infection nor to guide or     monitor treatment for     MRSA infections.   Assessment: 67 YO male transferred to ICU [facility] with decomposing neck wound around trach site and subsequent hole in carotid artery causing hemorrhage and cardiac arrest. Patient was on Cefepime and Flagyl day #4/14 for pan sensitive Pseudomonas in the sputum and foul drainage from neck wound. Discussed with MD and will narrow to one drug for remainder of therapy to cover both Pseudomonas and anaerobic organisms. Patient does NOT report a penicillin allergy.   WBC 15.1, Afebrile, SCr stable at 0.86.   Goal of Therapy:  Clinical eradication of infection  Plan:  1. Discontinue Cefepime and Flagyl.  2. Start Zosyn 3.375g IV q8h- 4hr infusion. Stop date 9/7 for total of 14 days of antibiotic therapy.  3. Monitor clinical response and evaluate renal  function.   Link Snuffer, PharmD, BCPS Clinical Pharmacist [phone] 09/27/2012,12:25 PM

## 2012-09-27 NOTE — Progress Notes (Signed)
[facility] PCCM  [facility] Jason Branch for a [employer]. Upon my arrival he had a pulse and was breathing spontaneously but with significant difficulty. There is bright red blood pouring a very rapid rate from his tracheal stoma.  He began to develop respiratory difficulty. I performed an emergent bronchoscopy which showed a blood clot in the left mainstem bronchus which was mobile. I am urgently removed the nasal trumpet which was in his tracheal stoma, over a suction catheter and replaced it with a #6 Shiley cuffed tracheostomy tube. At that point I performed another emergent bronchoscopy which again demonstrated a mobile clot in and out of the left mainstem which could not be suctioned. We attempted suction with a standard 8 French suction catheter and were able to remove some blood.  Because of the bleeding, we notified otolaryngology and move the patient to the operating room holding area. Not long after our arrival he had cardiac arrest x2 do to PEA arrest. The most likely etiology of the PEA arrest with hypoxemia secondary to airway clot as well as bleeding and metabolic acidosis. He received epi x2 and a total of 5 minutes of CPR (initially 2 minutes, a second episode at 3 minutes).  We continued saline lavage and suctioning through the tracheostomy and were able to move air with bag ventilation through the tracheostomy easier after this. Upon arrival of Jason Branch the tracheal stoma and wound site was explored. Prior blood came from his trachea stoma at this time. A #7 endotracheal tube was passed into the tracheal stoma. I placed a central line emergently into the right femoral vein. The patient was typed and crossmatched and given IV fluids a rapid rate. He was transferred to the operating room where the case was discussed with anesthesiology.  Total CC time by me 83 minutes.  Jason Branch PCCM Pager: [phone] Cell: [phone] If  no response, call [phone]

## 2012-09-27 NOTE — Procedures (Signed)
PCCM Bronchoscopy Procedure Note  The patient was informed of the risks (including but not limited to bleeding, infection, respiratory failure, lung injury, tooth/oral injury) and benefits of the procedure and gave consent, see chart.  Indication: Bleeding from tracheal stoma, need for airway, airway occlusion  Location: [facility] holding area  Condition pre procedure: Critically ill, unstable  Medications for procedure: none  Procedure description: The bronchoscope was introduced through the nasal trumpet in the tracheostomy and passed to the carina. Bright red blood was noted throughout the trachea and a mobile clot was noted in the left mainstem bronchus. I attempted to suction this but it could not be removed.  A #6 cuffed Shiley tracheostomy tube was placed over a suction catheter and then the bronchoscope was reintroduced into the trachea. At this point the main carina was visible as was the mobile clot in the left mainstem which could not be moved. We attempted to suction with a #8 French suction catheter through the tracheostomy at this point.  Later the patient was moved to the operating room holding area where performed bronchoscopy again around the time of his cardiac arrest. At this point complete occlusion due to clot was noted in the trachea. This was partially removed by bronchoscope.  After the tracheostomy tube was removed and replaced with a #7 endotracheal tube bronchoscopy was performed again. At this point no clot was visible in the trachea and I was able to pass the bronchoscope to the segmental lobar bronchi bilaterally with no evidence of airway occlusion. However, there was still bright red blood coming from the proximal trachea. At this point the patient was taken to the operating room.   Procedures performed:  -Therapeutic aspiration of the airway -emergent tracheostomy change  Specimens sent: none  Condition post procedure: critically ill,  unstable  Patient instructions: to OR immediately  Yolonda Kida PCCM Pager: [phone] Cell: [phone] If no response, call [phone]

## 2012-09-27 NOTE — H&P (Addendum)
PULMONARY  / CRITICAL CARE MEDICINE  Name: Jason Branch MRN: [id number] DOB: 11-13-1945    ADMISSION DATE:  09-29-2012 CONSULTATION DATE:  09-29-12  REFERRING MD :  Transfer from [facility] PRIMARY SERVICE:  PCCM  CHIEF COMPLAINT:  Tracheostomy hemorrhage  BRIEF PATIENT DESCRIPTION: 67 yo with past medical history of laryngeal CA s/p laryngectomy and tracheostomy who developed brisk arterial hemorrhage.  Briefly coded but successfully resuscitated.  Underwent carotid artery ligation.   SIGNIFICANT EVENTS / STUDIES:  8/27  Soft tissue neck CT >>> Large soft tissue mass to the left of midline in the infrahyoid  space measuring 70 x 43 x 67 mm. In this patient with tracheostomy and total laryngectomy, this likely represents a hematoma or phlegmon. Residual tumor seems unlikely. No central liquefaction  to suggest abscess. 09-30-2022  Briefly coded secondary to hemorrhage from carotid artery 09-30-2022  OR >>> Carotid artery ligation  LINES / TUBES: Trach 30-Sep-2022 >>> Foley 2022/09/30 >>> L fem TLC 2022/09/30 >>> PEG - preadmission Medi-Port - preadmission   CULTURES: Wound culture - preadmission >>> Pansensetive pseudomonas  ANTIBIOTICS: Cefepime - preadmission Flagyl - preadmission Zosyn 30-Sep-2022 >>>  The patient is encephalopathic and unable to provide history, which was obtained for available medical records.  HISTORY OF PRESENT ILLNESS:  67 yo with past medical history of laryngeal CA s/p laryngectomy and tracheostomy who developed brisk arterial hemorrhage.  Briefly coded but successfully resuscitated.  Underwent carotid artery ligation.   PAST MEDICAL HISTORY :  No past medical history on file. No past surgical history on file.  Prior to Admission medications   Medication Sig Start Date End Date Taking? Authorizing Provider  acidophilus (RISAQUAD) CAPS capsule Take 1 capsule by mouth 2 (two) times daily.   Yes Historical Provider, MD  atenolol (TENORMIN) 25 MG tablet Take 25 mg by mouth daily.   Yes  Historical Provider, MD  atorvastatin (LIPITOR) 20 MG tablet Take 40 mg by mouth daily at 6 PM. Take with Zetia 10 mg   Yes Historical Provider, MD  buPROPion (WELLBUTRIN) 75 MG tablet Take 75 mg by mouth 3 (three) times daily.   Yes Historical Provider, MD  clonazePAM (KLONOPIN) 1 MG tablet Take 1 mg by mouth 3 (three) times daily as needed for anxiety.   Yes Historical Provider, MD  dextrose 5 % SOLN 50 mL with ceFEPIme 1 G SOLR 1 g Inject 1 g into the vein every 8 (eight) hours.   Yes Historical Provider, MD  enoxaparin (LOVENOX) 40 MG/0.4ML injection Inject 40 mg into the skin daily.   Yes Historical Provider, MD  ezetimibe (ZETIA) 10 MG tablet Take 10 mg by mouth daily at 6 PM. Take with Lipitor (Atorvastatin)  40 mg   Yes Historical Provider, MD  isosorbide dinitrate (ISORDIL) 20 MG tablet Take 20 mg by mouth 2 (two) times daily after a meal.   Yes Historical Provider, MD  levothyroxine (SYNTHROID, LEVOTHROID) 125 MCG tablet Take 125 mcg by mouth daily.   Yes Historical Provider, MD  lisinopril (PRINIVIL,ZESTRIL) 5 MG tablet Take 5 mg by mouth daily.   Yes Historical Provider, MD  metroNIDAZOLE (FLAGYL) 500 MG tablet Take 500 mg by mouth 3 (three) times daily. Starting 8/27-10/04/12 (8 days)   Yes Historical Provider, MD  modafinil (PROVIGIL) 100 MG tablet Take 100 mg by mouth daily.   Yes Historical Provider, MD  oxyCODONE (OXY IR/ROXICODONE) 5 MG immediate release tablet Take 5 mg by mouth every 4 (four) hours as needed for pain.  Yes Historical Provider, MD  pantoprazole (PROTONIX) 40 MG tablet Take 40 mg by mouth daily.   Yes Historical Provider, MD  vitamin C (ASCORBIC ACID) 500 MG tablet Take 2,000 mg by mouth daily.   Yes Historical Provider, MD  zinc sulfate 220 MG capsule Take 220 mg by mouth daily.   Yes Historical Provider, MD  HYDROmorphone (DILAUDID) 2 MG/ML injection Inject 0.5 mg into the vein every 4 (four) hours as needed (severe pain).    Historical Provider, MD   Allergies   Allergen Reactions  . Morphine And Related Other (See Comments)    unknown  . Rosuvastatin Other (See Comments)    unknown   FAMILY HISTORY:  No family history on file.  SOCIAL HISTORY:  has no tobacco, alcohol, and drug history on file.  REVIEW OF SYSTEMS:  Unable to provide.  INTERVAL HISTORY:  VITAL SIGNS: Temp:  [97.6 F (36.4 C)] 97.6 F (36.4 C) (08/28 1203) Pulse Rate:  [79-83] 83 (08/28 1202) Resp:  [16-28] 28 (08/28 1202) BP: (102-138)/(57-105) 105/57 mmHg (08/28 1202) SpO2:  [97 %-100 %] 98 % (08/28 1202) Arterial Line BP: (122-173)/(62-106) 136/69 mmHg (08/28 1100) FiO2 (%):  [60 %] 60 % (08/28 1203) Weight:  [61.2 kg (134 lb 14.7 oz)] 61.2 kg (134 lb 14.7 oz) (08/28 0900)  HEMODYNAMICS:   VENTILATOR SETTINGS: Vent Mode:  [-] PRVC FiO2 (%):  [60 %] 60 % Set Rate:  [16 bmp] 16 bmp Vt Set:  [600 mL] 600 mL PEEP:  [5 cmH20] 5 cmH20 Plateau Pressure:  [17 cmH20-20 cmH20] 17 cmH20  INTAKE / OUTPUT: Intake/Output     08/27 0701 - 08/28 0700 08/28 0701 - 08/29 0700   I.V. (mL/kg)  1060 (17.3)   Blood 700    Total Intake(mL/kg) 700 1060 (17.3)   Urine (mL/kg/hr)  60 (0.1)   Blood  300 (0.6)   Total Output   360   Net +700 +700          PHYSICAL EXAMINATION: General:  Appears acutely ill, mechanically ventilated, synchronous Neuro: Non verbally responsive, nonfocal, cough / gag diminished HEENT:  PERRL Neck:  Large tracheostomy wound with tracheostomy cuff visible Cardiovascular:  RRR, no m/r/g Lungs:  Bilateral scattered rhonchi Abdomen:  Soft, nontender, bowel sounds diminished Musculoskeletal:  Moves all extremities, no edema Skin:  Intact  LABS:  Recent Labs Lab 09/22/12 0535  09/27/12 0500 09/27/12 0523  09/27/12 0649 09/27/12 0737 09/27/12 1126 09/27/12 1130  HGB 11.7*  --  11.8*  --   --  8.2* 11.2*  --  12.6*  WBC 6.8  --  15.1*  --   --   --   --   --  33.1*  PLT 253  --  384  --   --   --   --   --  293  NA 137  --   --  136   --  142 142  --  137  K 3.5  --   --  4.8  --  3.6 3.8  --  4.3  CL 104  --   --  102  --   --   --   --  106  CO2 26  --   --  24  --   --   --   --  21  GLUCOSE 114*  --   --  130*  --   --   --   --  176*  BUN 11  --   --  17  --   --   --   --  21  CREATININE 0.78  --   --  0.77  --   --   --   --  0.86  CALCIUM 8.2*  --   --  8.2*  --   --   --   --  7.1*  MG 1.8  --   --   --   --   --   --   --  1.6  PHOS  --   --   --   --   --   --   --   --  1.9*  AST 19  --   --  57*  --   --   --   --  38*  ALT 20  --   --  85*  --   --   --   --  68*  ALKPHOS 48  --   --  66  --   --   --   --  59  BILITOT 0.4  --   --  0.3  --   --   --   --  0.6  PROT 5.1*  --   --  5.9*  --   --   --   --  5.0*  ALBUMIN 2.1*  --   --  2.2*  --   --   --   --  1.9*  APTT  --   --  30  --   --   --   --   --  30  INR  --   --  1.16  --   --   --   --   --  1.33  LATICACIDVEN  --   --   --   --   --   --   --   --  1.2  TROPONINI  --   --   --   --   --   --   --   --  <0.30  PROCALCITON 0.31  --   --   --   --   --   --   --   --   PHART  --   < >  --   --   < > 7.192* 7.293* 7.421  --   PCO2ART  --   < >  --   --   < > 59.8* 44.4 32.4*  --   PO2ART  --   < >  --   --   < > 70.0* 114.0* 68.0*  --   < > = values in this interval not displayed.  Recent Labs Lab 09/27/12 1136  GLUCAP 166*   CXR:  8/28 >>>  ASSESSMENT / PLAN:  PULMONARY A:  Acute respiratory failure in setting of massive blood aspiration.  S/p laryngectomy for laryngeal CA. P:   Gaol SpO2>92, pH>7.30 Full mechanical support Daily SBT Trend ABG / CXR  CARDIOVASCULAR A: Status post cardiac arrest in setting of brisk arterial hemorrhage.  S/p carotid artery ligation. Hypertension.  Dyslipidemia.   P:  Goal MAP>60 Trend troponin / lactate Continue Lipitor, Zetia Hold Isordil, Zestril, Atenolol  RENAL A:  Normal renal function. Hypomagnesemia.  Hypophosphatemia.  P:   Trend BMP, Mg, Phos Sodium phosphate 10 x 1 Magnesium 2  x 1 NS@100   GASTROINTESTINAL A:  Severe malnutrition. Mildly elevated transaminases. P:   NPO as intubated Nutritionist consuly Protonix for GI  Px Supplements  HEMATOLOGIC A:  Acute blood loss anemia. P:  Trend CBC SCDs for DVT Px  INFECTIOUS A:  Suspected neck phlegmon. P:   D/c Cefepime and Flagyl Start Zosyn  ENDOCRINE  A:  Hyperglycemia. Hypothyroidism.   P:   SSI Levothyroxine   NEUROLOGIC A: Miled encephalopathy / anoxia. P:   Goal RASS 0 to -1 Fentanyl PRN Continue Wellbutrin Hold Provigil  I have personally obtained a history, examined the patient, evaluated laboratory and imaging results, formulated the assessment and plan and placed orders.  CRITICAL CARE:  The patient is critically ill with multiple organ systems failure and requires high complexity decision making for assessment and support, frequent evaluation and titration of therapies, application of advanced monitoring technologies and extensive interpretation of multiple databases. Critical Care Time devoted to patient care services described in this note is 75 minutes.   Lonia Farber, MD Pulmonary and Critical Care Medicine [facility] Pager: [phone]  09/27/2012, 2:50 PM

## 2012-09-27 NOTE — Progress Notes (Signed)
Responded to code blue call on 5E, upon arrival pt being bagged with mask 100% FIO2 trachea area pouring bright red blood and nasal trumpet in stoma. Shortly after arrival patient went into cardiac arrest and compressions started, pulse returned. MD removed and placed #6 shiley cuffed without issue using bronch scope to assist in clearing airway, good color change and BIL breath sounds present. Transported pt to the OR bagged with 100% FIO2 via trach.

## 2012-09-27 NOTE — Progress Notes (Signed)
Discussed hypotension with Dr Herma Carson. After antihypertensive administration. No new orders

## 2012-09-28 ENCOUNTER — Inpatient Hospital Stay (HOSPITAL_COMMUNITY): Payer: Medicare Other

## 2012-09-28 ENCOUNTER — Encounter (HOSPITAL_COMMUNITY): Payer: Self-pay | Admitting: Otolaryngology

## 2012-09-28 ENCOUNTER — Other Ambulatory Visit (HOSPITAL_COMMUNITY): Payer: Self-pay

## 2012-09-28 ENCOUNTER — Inpatient Hospital Stay
Admission: AD | Admit: 2012-09-28 | Discharge: 2012-10-26 | Disposition: A | Discharge status: Skilled Nursing Facility | Payer: Self-pay | Source: Intra-hospital | Attending: Internal Medicine | Admitting: Internal Medicine

## 2012-09-28 DIAGNOSIS — Z930 Tracheostomy status: Secondary | ICD-10-CM

## 2012-09-28 DIAGNOSIS — J9509 Other tracheostomy complication: Secondary | ICD-10-CM

## 2012-09-28 DIAGNOSIS — J9600 Acute respiratory failure, unspecified whether with hypoxia or hypercapnia: Secondary | ICD-10-CM

## 2012-09-28 DIAGNOSIS — I469 Cardiac arrest, cause unspecified: Secondary | ICD-10-CM

## 2012-09-28 LAB — GLUCOSE, CAPILLARY
Glucose-Capillary: 106 mg/dL — ABNORMAL HIGH (ref 70–99)
Glucose-Capillary: 116 mg/dL — ABNORMAL HIGH (ref 70–99)

## 2012-09-28 LAB — BASIC METABOLIC PANEL
BUN: 31 mg/dL — ABNORMAL HIGH (ref 6–23)
Calcium: 7.3 mg/dL — ABNORMAL LOW (ref 8.4–10.5)
GFR calc non Af Amer: 72 mL/min — ABNORMAL LOW (ref >90)
Glucose, Bld: 130 mg/dL — ABNORMAL HIGH (ref 70–99)
Sodium: 137 mEq/L (ref 135–145)

## 2012-09-28 LAB — CBC
Hemoglobin: 9.8 g/dL — ABNORMAL LOW (ref 13.0–17.0)
MCH: 31.1 pg (ref 26.0–34.0)
MCHC: 34.9 g/dL (ref 30.0–36.0)

## 2012-09-28 LAB — LACTIC ACID, PLASMA: Lactic Acid, Venous: 1.7 mmol/L (ref 0.5–2.2)

## 2012-09-28 LAB — PHOSPHORUS: Phosphorus: 3 mg/dL (ref 2.3–4.6)

## 2012-09-28 LAB — TROPONIN I: Troponin I: <0.3 ng/mL (ref <0.30)

## 2012-09-28 MED ORDER — PANTOPRAZOLE SODIUM 40 MG IV SOLR: 40.0000 mg | INTRAVENOUS | Status: AC

## 2012-09-28 MED ORDER — INSULIN ASPART 100 UNIT/ML ~~LOC~~ SOLN: 0.0000 [IU] | SUBCUTANEOUS | Status: AC

## 2012-09-28 MED ORDER — PIPERACILLIN-TAZOBACTAM 3.375 G IVPB: 3.3750 g | Freq: Three times a day (TID) | INTRAVENOUS | Status: AC

## 2012-09-28 MED ORDER — SODIUM CHLORIDE 0.9 % IV SOLN: INTRAVENOUS | Status: AC

## 2012-09-28 MED ORDER — VITAL AF 1.2 CAL PO LIQD: 1000.0000 mL | ORAL | Status: AC

## 2012-09-28 MED ORDER — FREE WATER
150.0000 mL | Freq: Four times a day (QID) | Status: DC
Start: 2012-09-28 — End: 2012-09-28

## 2012-09-28 MED ORDER — VITAL AF 1.2 CAL PO LIQD
1000.0000 mL | ORAL | Status: DC
  Filled 2012-09-28 (×2): qty 1000

## 2012-09-28 MED ORDER — BIOTENE DRY MOUTH MT LIQD: 15.0000 mL | Freq: Four times a day (QID) | OROMUCOSAL | Status: AC

## 2012-09-28 MED ORDER — FENTANYL CITRATE 0.05 MG/ML IJ SOLN: 25.0000 ug | INTRAMUSCULAR | Status: AC | PRN

## 2012-09-28 MED ORDER — FREE WATER: 150.0000 mL | Freq: Four times a day (QID) | Status: AC

## 2012-09-28 NOTE — Anesthesia Postprocedure Evaluation (Signed)
  Anesthesia Post-op Note  Patient: Jason Branch  Procedure(s) Performed: Procedure(s): RIGHT NECK EXPLORATION; WITH CAROTID LIGATION (Right)  Patient Location: ICU  Anesthesia Type:General  Level of Consciousness: sedated  Airway and Oxygen Therapy: Patient remains intubated per anesthesia plan and Patient placed on Ventilator (see vital sign flow sheet for setting)  Post-op Pain: none  Post-op Assessment: Post-op Vital signs reviewed, Patient's Cardiovascular Status Stable, Respiratory Function Stable, Patent Airway, No signs of Nausea or vomiting and Pain level controlled  Post-op Vital Signs: Reviewed and stable  Complications: No apparent anesthesia complications

## 2012-09-28 NOTE — Progress Notes (Signed)
Per Dr Herma Carson. Discontinue cvc right thigh, and aline prior to d/c [facility]

## 2012-09-28 NOTE — Progress Notes (Signed)
INITIAL NUTRITION ASSESSMENT  Pt meets criteria for SEVERE MALNUTRITION in the context of chronic illness as evidenced by 21% weight loss x 10 months, intake of < 75% of his needs for > 1 month.  DOCUMENTATION CODES Per approved criteria  -Severe malnutrition in the context of chronic illness   INTERVENTION:  1. Initiate Vital AF 1.2 @ 15 ml/hr via PEG and increase by 10 ml every 8 hours to goal rate of 65 ml/hr 3  2. 150 ml H2O every 6 hours  At goal rate, tube feeding regimen will provide 1872 kcal, 117 grams of protein, and 1265 ml of H2O. Total free water: 1865 ml.    NUTRITION DIAGNOSIS: Inadequate oral intake related to inability to eat as evidenced by NPO status.  Goal: Pt to meet >/= 90% of their estimated nutrition needs   Monitor:  Vent status, TF tolerance, labs, weight trend  Reason for Assessment: Consult received to initiate and manage enteral nutrition support.  67 y.o. male  Admitting Dx: Tracheostomy hemorrhage  ASSESSMENT: 67 yo with past medical history of laryngeal CA s/p laryngectomy and tracheostomy (sugery at [facility], then transferred to [facility]) who developed brisk arterial hemorrhage. Briefly coded but successfully resuscitated. Underwent carotid artery ligation.  Pt discussed during ICU rounds and with RN.  Pt on trach collar. Extensive history reviewed with daughter. Per daughter pt weighed 170-180 lb 10/13. Pt had PEG placed prior to chemo/XRT in October. Pt had PEG removed by Christmas. In July cancer returned and had surgery in early August. PEG was not inserted until 8/21. Daughter concerned that pt has had tube feeding coming out of open area around trach site. Family reports necrotic looking around trach site prior to surgery here. Pt has hx of constipation. Unsure of last bm, discussed this with nursing.   Nutrition Focused Physical Exam:  Subcutaneous Fat:  Orbital Region: WNL Upper Arm Region: severe wasting Thoracic and Lumbar Region:  severe wasting  Muscle:  Temple Region: severe wasting Clavicle Bone Region: severe wasting Clavicle and Acromion Bone Region: severe wasting Scapular Bone Region: severe wasting Dorsal Hand: severe wasting Patellar Region: severe wasting Anterior Thigh Region: mild moderate wasting Posterior Calf Region: mild-moderate wasting  Edema: not present   Height: Ht Readings from Last 1 Encounters:  09/27/12 5\' 7"  (1.702 m)    Weight: Wt Readings from Last 1 Encounters:  09/28/12 135 lb 12.9 oz (61.6 kg)    Ideal Body Weight: 67.2 kg   % Ideal Body Weight: 92%  Wt Readings from Last 10 Encounters:  09/28/12 135 lb 12.9 oz (61.6 kg)    Usual Body Weight: 170 lb 10/13  % Usual Body Weight: 79%  BMI:  Body mass index is 21.26 kg/(m^2).  Estimated Nutritional Needs: Kcal: 1700-1900 Protein: 90-110 grams Fluid: > 1.7 L/day  Skin: stage I pressure ulcer on sacrum; neck incision  Diet Order: NPO  EDUCATION NEEDS: -No education needs identified at this time   Intake/Output Summary (Last 24 hours) at 09/28/12 1221 Last data filed at 09/28/12 1000  Gross per 24 hour  Intake    740 ml  Output    450 ml  Net    290 ml    Last BM: not documented   Labs:   Recent Labs Lab 09/22/12 0535 09/27/12 0523  09/27/12 0737 09/27/12 1130 09/28/12 0500  NA 137 136  < > 142 137 137  K 3.5 4.8  < > 3.8 4.3 3.9  CL 104 102  --   --  106 105  CO2 26 24  --   --  21 22  BUN 11 17  --   --  21 31*  CREATININE 0.78 0.77  --   --  0.86 1.05  CALCIUM 8.2* 8.2*  --   --  7.1* 7.3*  MG 1.8  --   --   --  1.6 2.3  PHOS  --   --   --   --  1.9* 3.0  GLUCOSE 114* 130*  --   --  176* 130*  < > = values in this interval not displayed.  CBG (last 3)   Recent Labs  09/28/12 0018 09/28/12 0345 09/28/12 0814  GLUCAP 111* 106* 111*    Scheduled Meds: . acidophilus  1 capsule Oral BID  . antiseptic oral rinse  15 mL Mouth Rinse QID  . atorvastatin  40 mg Oral q1800  .  buPROPion  75 mg Oral TID  . chlorhexidine  15 mL Mouth Rinse BID  . ezetimibe  10 mg Oral q1800  . insulin aspart  0-15 Units Subcutaneous Q4H  . levothyroxine  125 mcg Oral QAC breakfast  . pantoprazole (PROTONIX) IV  40 mg Intravenous Q24H  . piperacillin-tazobactam (ZOSYN)  IV  3.375 g Intravenous Q8H  . vitamin C  2,000 mg Oral Daily  . zinc sulfate  220 mg Oral Daily    Continuous Infusions: . sodium chloride 20 mL/hr at 09/27/12 1015  . sodium chloride 100 mL/hr at 09/28/12 1610    Past Medical History  Diagnosis Date  . Cancer     SQUAMOUS CELL CARCINOMA OF VOCAL CORDS  . Hypertension   . Peripheral vascular disease   . COPD (chronic obstructive pulmonary disease)   . Hyperlipidemia   . Chronic kidney disease     CHRONIC RENAL INSUFFICIENCY  . Aortic stenosis, mild   . Malnutrition     PROTEIN CALORIE  . Thrombophlebitis   . Mental disorder     PTSD  . Anxiety     Past Surgical History  Procedure Laterality Date  . Thyroidectomy    . Total laryngectomy      WITH SPEECH PROSTHESIS  . Neck dissection  09/04/2012  . Dobbhoff tube    . Lymph node biopsy    . Hemorroidectomy    . Tracheostomy tube placement Right 09/27/2012    Procedure: RIGHT NECK EXPLORATION; WITH CAROTID LIGATION;  Surgeon: Flo Shanks, MD;  Location: [facility] OR;  Service: ENT;  Laterality: Right;    Kendell Bane RD, LDN, CNSC [phone] Pager [phone] After Hours Pager

## 2012-09-28 NOTE — Progress Notes (Signed)
Report called into [facility], RN [facility]

## 2012-09-28 NOTE — Consult Note (Signed)
Physician Discharge Summary     Patient ID: Jason Branch MRN: [id number] DOB/AGE: 1946/01/30 67 y.o.  Admit date: 09/27/2012 Discharge date: 09/28/2012  Discharge Diagnoses:  Acute respiratory failure in setting of massive blood aspiration.  S/p laryngectomy for laryngeal CA.   Status post cardiac arrest in setting of brisk arterial hemorrhage.  S/p carotid artery ligation. Hypertension.  Dyslipidemia.  Normal renal function. Hypomagnesemia. Hypophosphatemia.  Severe malnutrition. Mildly elevated transaminases.  Acute blood loss anemia.  Suspected neck phlegmon.  Hyperglycemia.  Hypothyroidism.  Miled encephalopathy / anoxia.      Detailed Hospital Course:    67 yo with past medical history of laryngeal CA s/p laryngectomy and tracheostomy who developed brisk arterial hemorrhage on 8/28 resulting in CODE BLUE. His ROSC was obtained rapidly and on PCCM arrival pt was breathing spontaneously, but there was bright red blood pouring a very rapid rate from his tracheal stoma. Underwent emergent bronchoscopy which showed a blood clot in the left mainstem bronchus which was mobile. He developed progressive resp distress and at that point the nasal trumpet which was in his tracheal stoma, was removed over a suction catheter and replaced it with a #6 Shiley cuffed tracheostomy tube. At that point another emergent bronchoscopy which again demonstrated a mobile clot in and out of the left mainstem which could not be suctioned. We attempted suction with a standard 8 French suction catheter and were able to remove some blood. Not long after our arrival he had cardiac arrest x2 do to PEA arrest. The most likely etiology of the PEA arrest with hypoxemia secondary to airway clot as well as bleeding and metabolic acidosis. He received epi x2 and a total of 5 minutes of CPR (initially 2 minutes, a second episode at 3 minutes). We continued saline lavage and suctioning through the tracheostomy and were  able to move air with bag ventilation through the tracheostomy easier after this. Upon arrival of Dr.Wolicki the tracheal stoma and wound site was explored. Prior blood came from his trachea stoma at this time. A #7 endotracheal tube was passed into the tracheal stoma.   Once he was able we transferred him to [facility] for Emergent [facility]. He was brought to the operating room with the bleeding temporally controlled by pressure. Wound packing was removed and no bleeding was observed. He had been ventilated with an endotracheal tube. This was removed, and using a Yankauer suction, no significant clots were evacuated from the trachea or bronchi. The endotracheal tube was replaced in the trachea stoma.  The transverse incision was opened for approximately 4 cm on the right side allowing access to a wound cavity. Upon evacuating clots, once again very brisk bleeding was noted. 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 Ligation was deemed appropriate. The carotid artery was dissected from eschar and fibrosis and ligated. Additional bleeding was controlled w/ electrocautery. #6 cuffed shiley was placed.  He was transferred to the MICU pos-op for close obs of bleeding.   He was monitored in the intensive care for the following 24 hrs. Transitioned off mechanical ventilation back to Trach collar. He is now stable for transfer back to [facility] where he can continue his rehab efforts. His plan of care is as outlined below:    Discharge Plan by diagnoses   S/p laryngectomy for laryngeal CA.  Acute respiratory failure in setting of massive blood aspiration  in setting of carotid artery rupture.  S/p ligation of right carotid artery and #6 shiley 8/28 Probable aspiration pneumonia  Plan/rec -continue with trach collar trials as tolerated.  -pulm hygiene  -cont empiric zosyn  -needs wound packing  removed next 24h -Could discuss w/ ENT in next 7d re: timing of removal    Status post cardiac arrest in setting of brisk arterial hemorrhage.   Hypertension. Dyslipidemia.  P:  Continue Lipitor, Zetia  Hold Isordil, Zestril, Atenolol   Severe malnutrition. Mildly elevated transaminases.  P:    Nutritionist consult Protonix for GI Px  Supplements   Acute blood loss anemia.  P:  Trend CBC  SCDs for DVT Px   Suspected neck phlegmon.  P:   Start Zosyn, plan 7-10d   Hyperglycemia. Hypothyroidism.  P:  SSI  Levothyroxine    Miled encephalopathy r/o anoxia.  P:  Goal RASS 0 to -1  Fentanyl PRN  Continue Wellbutrin  Hold Provigil   Significant Hospital tests/ studies/ interventions and procedures  Consults ENT: wolicki   Discharge Exam: BP 110/59  Pulse 72  Temp(Src) 97.9 F (36.6 C) (Oral)  Resp 20  Ht 5\' 7"  (1.702 m)  Wt 61.6 kg (135 lb 12.9 oz)  BMI 21.26 kg/m2  SpO2 100%  General: Appears acutely ill, now on ATC Neuro: Non verbally responsive, nonfocal, cough / gag diminished  HEENT: PERRL  Neck: Large tracheostomy wound with tracheostomy cuff visible  Cardiovascular: RRR, no m/r/g  Lungs: Bilateral scattered rhonchi  Abdomen: Soft, nontender, bowel sounds diminished  Musculoskeletal: Moves all extremities, no edema  Skin: Intact   Labs at discharge Lab Results  Component Value Date   CREATININE 1.05 09/28/2012   BUN 31* 09/28/2012   NA 137 09/28/2012   K 3.9 09/28/2012   CL 105 09/28/2012   CO2 22 09/28/2012   Lab Results  Component Value Date   WBC 14.3* 09/28/2012   HGB 9.8* 09/28/2012   HCT 28.1* 09/28/2012   MCV 89.2 09/28/2012   PLT 243 09/28/2012   Lab Results  Component Value Date   ALT 68* 09/27/2012   AST 38* 09/27/2012   ALKPHOS 59 09/27/2012   BILITOT 0.6 09/27/2012   Lab Results  Component Value Date   INR 1.33 09/27/2012   INR 1.16 09/27/2012    Current radiology studies Dg Chest Port 1 View  09/28/2012   *RADIOLOGY REPORT*   Clinical Data: Assess tracheostomy, history hypertension, COPD, cancer  PORTABLE CHEST - 1 VIEW  Comparison: Portable exam 0535 hours compared to 09/22/2012  Findings: Tracheostomy tube projects over tracheal air column with tip at clavicular heads. Right jugular Port-A-Cath stable with tip projecting over distal SVC. Normal heart size, mediastinal contours, and pulmonary vascularity. Bibasilar atelectasis with increased opacification of the left lower lobe cannot exclude consolidation. Upper lungs clear. Central peribronchial thickening noted. No gross pleural effusion or pneumothorax. Surgical clips in the right cervical region.  IMPRESSION: Bronchitic changes with bibasilar atelectasis. Cannot exclude left lower lobe pneumonia.   Original Report Authenticated By: Ulyses Southward, M.D.    Dis[facility]:  [facility]      Discharge Orders   Future Orders Complete By Expires   Diet -NPO, continue tube feeds, needs dietary f/u  As directed    Increase activity slowly: needs PT/OT consult  As directed        Medication List    STOP taking these medications       atenolol 25 MG tablet (was home med,  should be re-assessed)  Commonly known as:  TENORMIN     dextrose 5 % SOLN 50 mL with ceFEPIme 1 G SOLR 1 g     enoxaparin 40 MG/0.4ML injection  Commonly known as:  LOVENOX     HYDROmorphone 2 MG/ML injection  Commonly known as:  DILAUDID     isosorbide dinitrate 20 MG tablet (was home med, should be re-assessed)  Commonly known as:  ISORDIL     lisinopril 5 MG tablet (was home med, should be re-assessed)  Commonly known as:  PRINIVIL,ZESTRIL     metroNIDAZOLE 500 MG tablet  Commonly known as:  FLAGYL     modafinil 100 MG tablet   Commonly known as:  PROVIGIL     oxyCODONE 5 MG immediate release tablet  Commonly known as:  Oxy IR/ROXICODONE     pantoprazole 40 MG tablet  Commonly known as:  PROTONIX  Replaced by:  pantoprazole 40 MG injection      TAKE these  medications       acidophilus Caps capsule  Take 1 capsule by mouth 2 (two) times daily.     antiseptic oral rinse Liqd  15 mLs by Mouth Rinse route QID.     atorvastatin 20 MG tablet  Commonly known as:  LIPITOR  Take 40 mg by mouth daily at 6 PM. Take with Zetia 10 mg     buPROPion 75 MG tablet  Commonly known as:  WELLBUTRIN  Take 75 mg by mouth 3 (three) times daily.     clonazePAM 1 MG tablet  Commonly known as:  KLONOPIN  Take 1 mg by mouth 3 (three) times daily as needed for anxiety.     ezetimibe 10 MG tablet  Commonly known as:  ZETIA  Take 10 mg by mouth daily at 6 PM. Take with Lipitor (Atorvastatin)  40 mg     feeding supplement (VITAL AF 1.2 CAL) Liqd  Place 1,000 mLs into feeding tube continuous.     fentaNYL 0.05 MG/ML injection  Commonly known as:  SUBLIMAZE  Inject 0.5-2 mLs (25-100 mcg total) into the vein every 2 (two) hours as needed for severe pain.     free water Soln  Place 150 mLs into feeding tube every 6 (six) hours.     insulin aspart 100 UNIT/ML injection  Commonly known as:  novoLOG  Inject 0-15 Units into the skin every 4 (four) hours.     levothyroxine 125 MCG tablet  Commonly known as:  SYNTHROID, LEVOTHROID  Take 125 mcg by mouth daily.     pantoprazole 40 MG injection  Commonly known as:  PROTONIX  Inject 40 mg into the vein daily.     piperacillin-tazobactam 3.375 GM/50ML IVPB  Commonly known as:  ZOSYN  Inject 50 mLs (3.375 g total) into the vein every 8 (eight) hours.     sodium chloride 0.9 % infusion  kvo     sodium chloride 0.9 % infusion  100 ml/hr     vitamin C 500 MG tablet  Commonly known as:  ASCORBIC ACID  Take 2,000 mg by mouth daily.     zinc sulfate 220 MG capsule  Take 220 mg by mouth daily.         Discharged Condition: fair  Physician Statement:   The Patient was personally examined, the discharge assessment and plan has been personally reviewed and I agree with ACNP Babcock's assessment and  plan. > 30 minutes of time have been dedicated to discharge assessment,  planning and discharge instructions.   SignedAnders Simmonds 09/28/2012, 1:46 PM  Lonia Farber, MD Pulmonary and Critical Care Medicine [facility] Pager: [phone]

## 2012-09-28 NOTE — Progress Notes (Addendum)
[facility] already have all need paperwork. [employer] form delivered with  patient

## 2012-09-30 LAB — BASIC METABOLIC PANEL
GFR calc Af Amer: >90 mL/min (ref >90)
GFR calc non Af Amer: >90 mL/min (ref >90)
Potassium: 3.2 mEq/L — ABNORMAL LOW (ref 3.5–5.1)
Sodium: 131 mEq/L — ABNORMAL LOW (ref 135–145)

## 2012-09-30 LAB — CBC
MCHC: 34.4 g/dL (ref 30.0–36.0)
Platelets: 230 10*3/uL (ref 150–400)
RDW: 15.4 % (ref 11.5–15.5)

## 2012-10-01 LAB — TYPE AND SCREEN
ABO/RH(D): A POS
Antibody Screen: NEGATIVE
Unit division: 0
Unit division: 0

## 2012-10-02 ENCOUNTER — Other Ambulatory Visit (HOSPITAL_COMMUNITY): Payer: Self-pay

## 2012-10-02 ENCOUNTER — Encounter: Payer: Self-pay | Admitting: *Deleted

## 2012-10-02 LAB — BASIC METABOLIC PANEL
CO2: 25 mEq/L (ref 19–32)
GFR calc non Af Amer: >90 mL/min (ref >90)
Glucose, Bld: 201 mg/dL — ABNORMAL HIGH (ref 70–99)
Potassium: 3.8 mEq/L (ref 3.5–5.1)
Sodium: 131 mEq/L — ABNORMAL LOW (ref 135–145)

## 2012-10-02 LAB — CBC
Hemoglobin: 10.9 g/dL — ABNORMAL LOW (ref 13.0–17.0)
Platelets: 306 10*3/uL (ref 150–400)
RBC: 3.53 MIL/uL — ABNORMAL LOW (ref 4.22–5.81)

## 2012-10-02 LAB — MAGNESIUM: Magnesium: 1.5 mg/dL (ref 1.5–2.5)

## 2012-10-02 MED ORDER — IOHEXOL 300 MG/ML  SOLN
80.0000 mL | Freq: Once | INTRAMUSCULAR | Status: AC | PRN
  Administered 2012-10-02: 80 mL via INTRAVENOUS

## 2012-10-04 LAB — CBC
HCT: 32.7 % — ABNORMAL LOW (ref 39.0–52.0)
MCV: 93.2 fL (ref 78.0–100.0)
Platelets: 354 10*3/uL (ref 150–400)
RBC: 3.51 MIL/uL — ABNORMAL LOW (ref 4.22–5.81)
WBC: 13.1 10*3/uL — ABNORMAL HIGH (ref 4.0–10.5)

## 2012-10-04 LAB — BASIC METABOLIC PANEL
CO2: 27 mEq/L (ref 19–32)
Chloride: 100 mEq/L (ref 96–112)
Creatinine, Ser: 0.75 mg/dL (ref 0.50–1.35)
GFR calc Af Amer: >90 mL/min (ref >90)
Potassium: 4.1 mEq/L (ref 3.5–5.1)

## 2012-10-05 NOTE — Consult Note (Signed)
NAMEALEM, FAHL NO.:  [id number]  MEDICAL RECORD NO.:  [id number]  LOCATION:                                FACILITY:  [facility]  PHYSICIAN:  Kristine Garbe. Ezzard Standing, M.D.DATE OF BIRTH:  [birth year]  DATE OF CONSULTATION:  10/04/2012 DATE OF DISCHARGE:                                CONSULTATION   REASON FOR CONSULT:  Evaluate the patient with complex neck wound following laryngectomy with pectoralis flap.  BRIEF HISTORY:  Jason Branch is a 67 year old gentleman who was diagnosed with squamous cell carcinoma originally in 2013 and treated primarily with chemoradiation that he completed in December 2013.  He had persistent disease and was diagnosed with stage IV laryngeal cancer in this past July and underwent a total laryngectomy with pectoralis flap reconstruction and bilateral neck dissections on September 04, 2012, in [place] at [facility].  Postoperatively, he was started on p.o. diet on August 13, and subsequently developed fistula on August 15 and underwent a G-tube placement on August 20.  He was transferred to [facility] a couple days later for care.  Three to four days following [facility], he had acute bleeding from the right neck wound and was taken emergently to the operating room by Dr. Lazarus Salines and underwent exploration of the right neck wound and ligation of the carotid artery on the right side.  He has done well since ligation with no mental status changes, but has a persistent large neck wound and fistula draining into the trachea.  I was consulted for assistance and wound care.  On examination, the patient has a large partially broken down tracheal stoma wound with tracheoesophageal prosthesis placed in the midline of the upper trachea.  He also has a large cavity to the right side of the trachea with a large amount of saliva flowing through the tracheoesophageal fistula.  He has had no recent  bleeding since ligation performed by Dr. Lazarus Salines. Neurologically, he is intact and awake and alert.  I reviewed the recent CT scan performed 3 or 4 days ago, and this showed a patent right carotid artery.  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  He has a pectoralis flap arising from the left side of his chest.  He has a very obvious large TE fistula on the right side.  The right carotid artery is not exposed to an open wound with 5-6 mm of tissue covering it from the wound down.  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  Recent right neck wound bleeding status post exploration and ligation with no further bleeding since the procedure. Recommend initially daily dressing changes with 0.25% acetic acid gauze to the right neck wound, right stoma wound.  Would keep the tracheostomy tube with a balloon functioning as this diminishes aspiration of saliva. We will follow up with the patient next week to observe how the wound is healing, if the fistula is closing.  Encourage good nutrition with his present G-tube.          ______________________________ Kristine Garbe. Ezzard Standing, M.D.    CEN/MEDQ  D:  10/04/2012  T:  10/04/2012  Job:  [id number]

## 2012-10-07 ENCOUNTER — Other Ambulatory Visit (HOSPITAL_COMMUNITY): Payer: Self-pay

## 2012-10-08 LAB — TSH: TSH: 31.46 u[IU]/mL — ABNORMAL HIGH (ref 0.350–4.500)

## 2012-10-08 LAB — T3: T3, Total: 30.6 ng/dl — ABNORMAL LOW (ref 80.0–204.0)

## 2012-10-08 LAB — T4, FREE: Free T4: 0.88 ng/dL (ref 0.80–1.80)

## 2012-10-09 LAB — BASIC METABOLIC PANEL
Chloride: 99 mEq/L (ref 96–112)
GFR calc Af Amer: >90 mL/min (ref >90)
Potassium: 4.1 mEq/L (ref 3.5–5.1)

## 2012-10-09 LAB — CBC
Platelets: 315 10*3/uL (ref 150–400)
RDW: 17.9 % — ABNORMAL HIGH (ref 11.5–15.5)
WBC: 9.7 10*3/uL (ref 4.0–10.5)

## 2012-10-11 LAB — OCCULT BLOOD X 1 CARD TO LAB, STOOL: Fecal Occult Bld: NEGATIVE

## 2012-10-12 LAB — BASIC METABOLIC PANEL
CO2: 27 mEq/L (ref 19–32)
Calcium: 8.7 mg/dL (ref 8.4–10.5)
Chloride: 97 mEq/L (ref 96–112)
GFR calc Af Amer: >90 mL/min (ref >90)
Sodium: 132 mEq/L — ABNORMAL LOW (ref 135–145)

## 2012-10-12 LAB — CBC
MCH: 32.1 pg (ref 26.0–34.0)
Platelets: 286 10*3/uL (ref 150–400)
RBC: 3.33 MIL/uL — ABNORMAL LOW (ref 4.22–5.81)
WBC: 8.7 10*3/uL (ref 4.0–10.5)

## 2012-10-14 LAB — CBC
MCV: 97.1 fL (ref 78.0–100.0)
Platelets: 147 10*3/uL — ABNORMAL LOW (ref 150–400)
RBC: 3.43 MIL/uL — ABNORMAL LOW (ref 4.22–5.81)
WBC: 8.1 10*3/uL (ref 4.0–10.5)

## 2012-10-16 ENCOUNTER — Other Ambulatory Visit (HOSPITAL_COMMUNITY): Payer: Self-pay

## 2012-10-16 LAB — BASIC METABOLIC PANEL
BUN: 40 mg/dL — ABNORMAL HIGH (ref 6–23)
CO2: 26 mEq/L (ref 19–32)
Chloride: 98 mEq/L (ref 96–112)
Glucose, Bld: 117 mg/dL — ABNORMAL HIGH (ref 70–99)
Potassium: 4.7 mEq/L (ref 3.5–5.1)

## 2012-10-16 LAB — CBC
HCT: 33.7 % — ABNORMAL LOW (ref 39.0–52.0)
Hemoglobin: 11.2 g/dL — ABNORMAL LOW (ref 13.0–17.0)
MCHC: 33.2 g/dL (ref 30.0–36.0)
RBC: 3.45 MIL/uL — ABNORMAL LOW (ref 4.22–5.81)

## 2012-10-18 ENCOUNTER — Other Ambulatory Visit (HOSPITAL_COMMUNITY): Payer: Medicare Other

## 2012-10-18 LAB — BASIC METABOLIC PANEL
BUN: 44 mg/dL — ABNORMAL HIGH (ref 6–23)
Creatinine, Ser: 0.77 mg/dL (ref 0.50–1.35)
GFR calc Af Amer: >90 mL/min (ref >90)
GFR calc non Af Amer: >90 mL/min (ref >90)
Glucose, Bld: 147 mg/dL — ABNORMAL HIGH (ref 70–99)

## 2012-10-18 LAB — CBC WITH DIFFERENTIAL/PLATELET
Basophils Relative: 1 % (ref 0–1)
Eosinophils Absolute: 0.2 10*3/uL (ref 0.0–0.7)
HCT: 31.8 % — ABNORMAL LOW (ref 39.0–52.0)
Hemoglobin: 10.7 g/dL — ABNORMAL LOW (ref 13.0–17.0)
Lymphs Abs: 0.7 10*3/uL (ref 0.7–4.0)
MCH: 32.9 pg (ref 26.0–34.0)
MCHC: 33.6 g/dL (ref 30.0–36.0)
MCV: 97.8 fL (ref 78.0–100.0)
Monocytes Absolute: 0.5 10*3/uL (ref 0.1–1.0)
Monocytes Relative: 6 % (ref 3–12)

## 2012-10-23 LAB — CULTURE, RESPIRATORY W GRAM STAIN

## 2012-10-24 LAB — CBC
Hemoglobin: 11.8 g/dL — ABNORMAL LOW (ref 13.0–17.0)
MCH: 33 pg (ref 26.0–34.0)
Platelets: 201 10*3/uL (ref 150–400)
RBC: 3.58 MIL/uL — ABNORMAL LOW (ref 4.22–5.81)
WBC: 9 10*3/uL (ref 4.0–10.5)

## 2012-10-24 LAB — BASIC METABOLIC PANEL
CO2: 27 mEq/L (ref 19–32)
Calcium: 8.9 mg/dL (ref 8.4–10.5)
GFR calc non Af Amer: >90 mL/min (ref >90)
Glucose, Bld: 153 mg/dL — ABNORMAL HIGH (ref 70–99)
Potassium: 4.1 mEq/L (ref 3.5–5.1)
Sodium: 141 mEq/L (ref 135–145)

## 2014-01-31 DEATH — deceased

## 2015-06-24 IMAGING — CR DG ABD PORTABLE 1V
1 series · 1 of 1 positions shown · IV contrast (omnipaque)
Comparison: None.

CLINICAL DATA: Confirm PEG placement.  50 L of Omnipaque injected
via existing gastrostomy tube.

PORTABLE ABDOMEN - 1 VIEW

[AP]
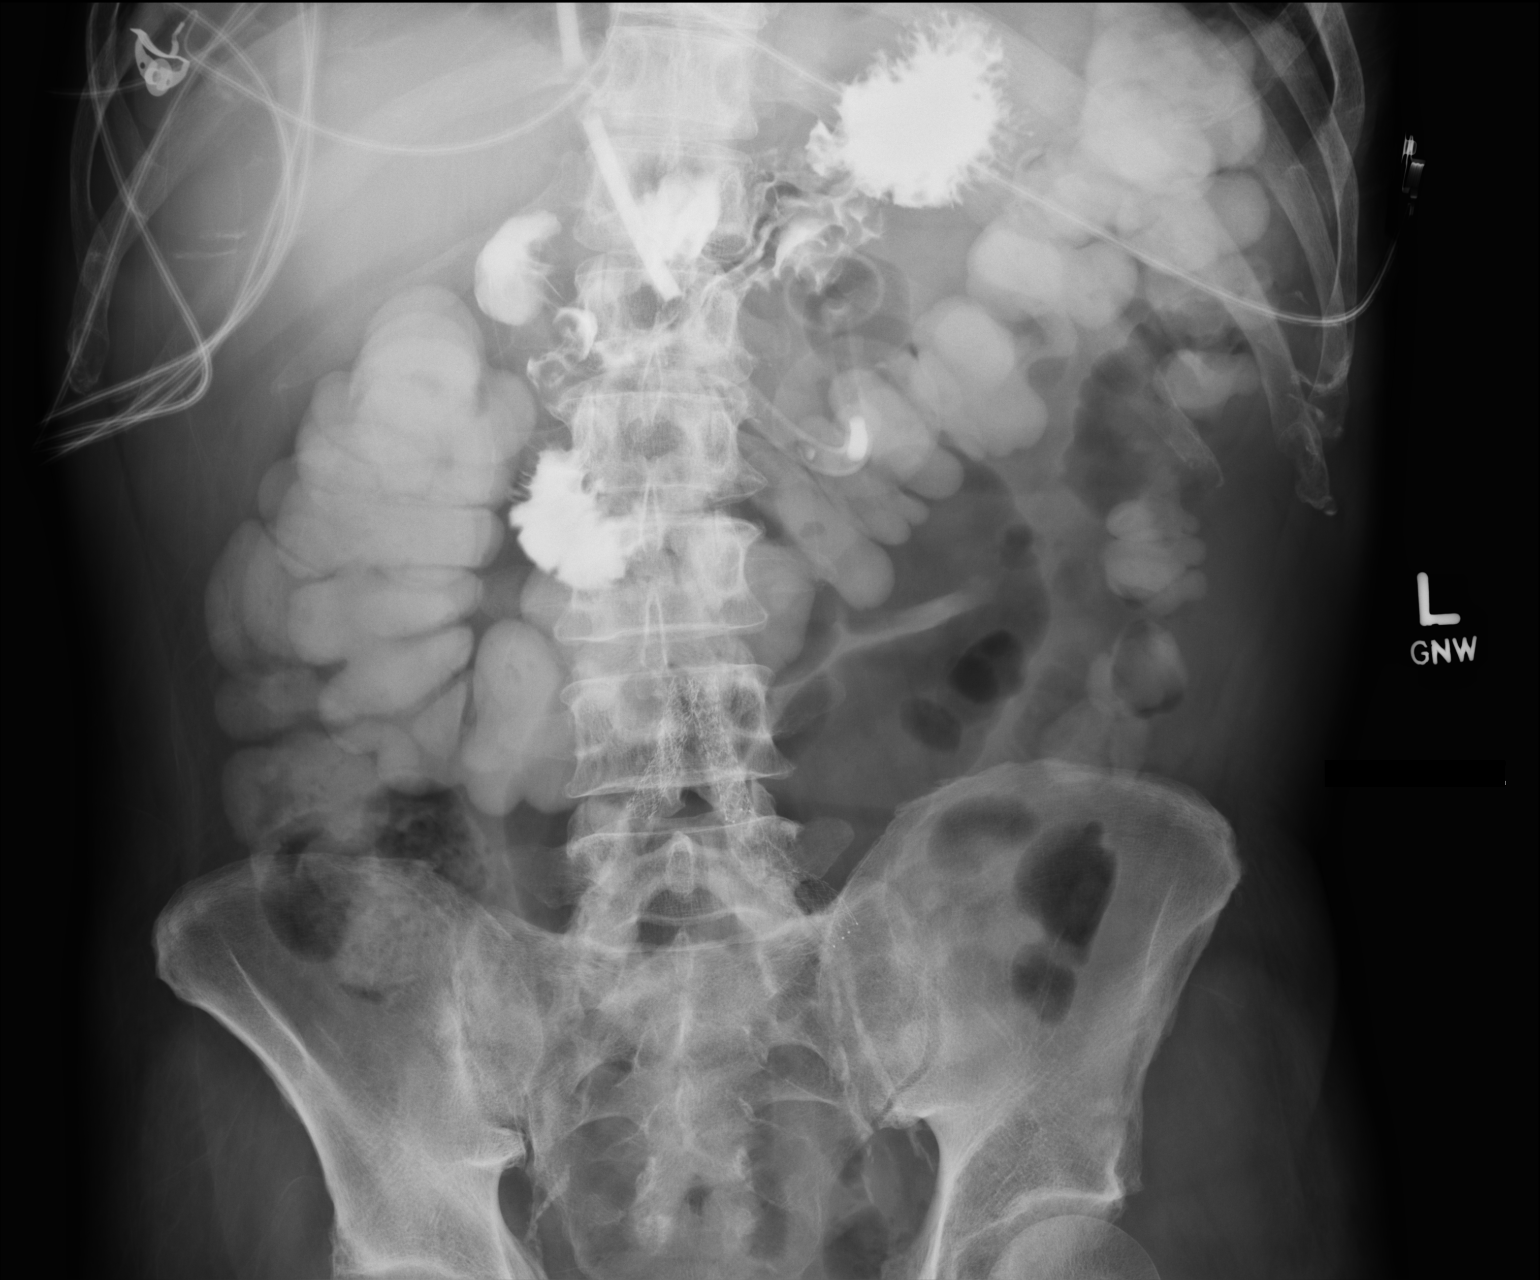

[1 of 1 positions shown; findings below may reference images not displayed]

FINDINGS: Examination demonstrates the patient's gastrostomy tube
with tip over the left upper quadrant.  Contrast is seen within the
tube and over the stomach. The gastrostomy tube appears to be in
adequate position and functioning normally.  No evidence of
extravasation of contrast.  A small amount contrast is seen within
the duodenum.  More dilute contrast is present within the colon.
Bowel gas pattern is nonobstructive.  There is minimal degenerative
change of the spine.  Bilateral iliac artery stents are present.
IMPRESSION: Nonobstructive bowel gas pattern.

Gastrostomy tube over the stomach in the left upper quadrant in
adequate position and functioning normally.

## 2015-06-28 IMAGING — CT CT NECK W/ CM
3 of 4 series · 16 of 33 positions shown, 19 images · IV contrast (CONTRAST)
Comparison: None.

CLINICAL DATA: Evaluate for abscess.  History of tracheostomy.

CT NECK WITH CONTRAST
TECHNIQUE: Multidetector CT imaging of the neck was performed with
intravenous contrast.
Contrast: 80mL OMNIPAQUE IOHEXOL 300 MG/ML  SOLN

[Series 2: soft tissue · axial · 0.29mm/px · z∈[+24,+210]mm · 8 of 121 slices shown, 10 images]
[im 14/121  soft-tissue]
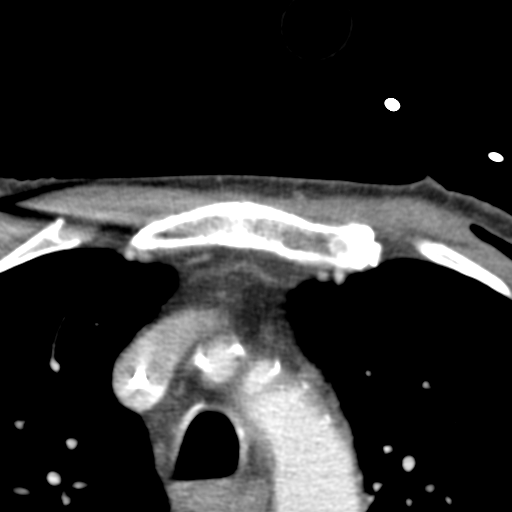
[im 14/121  bone]
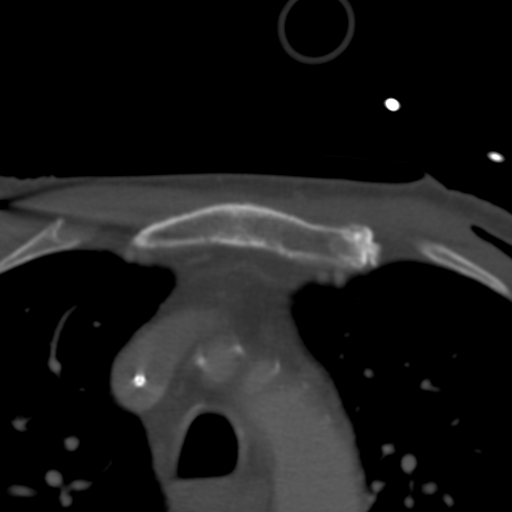
[im 27/121  bone]
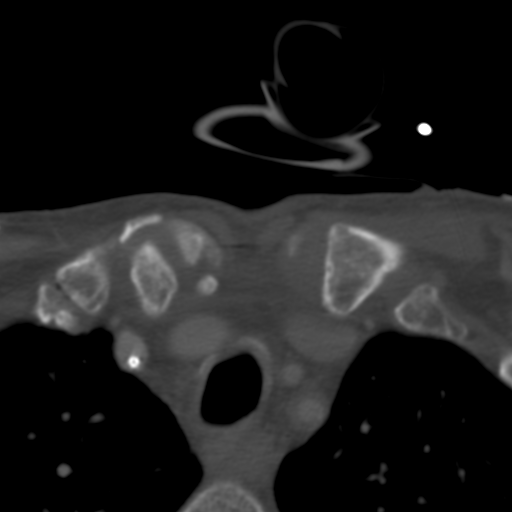
[im 41/121  bone]
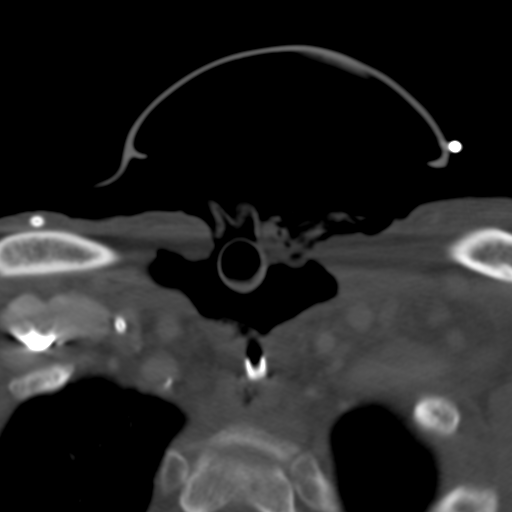
[im 54/121  bone]
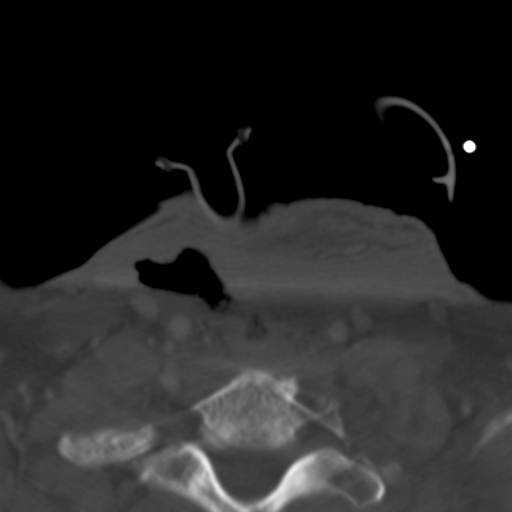
[im 67/121  soft-tissue]
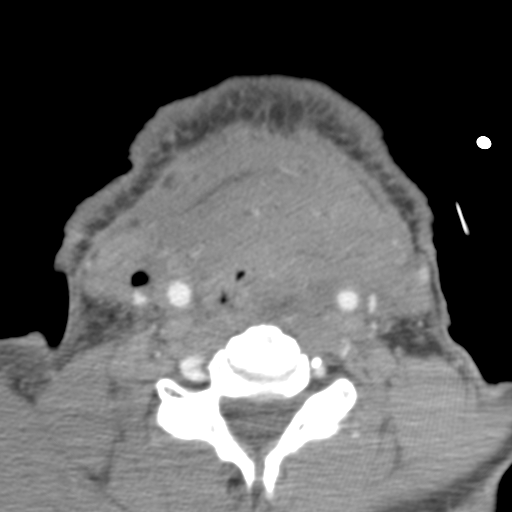
[im 67/121  bone]
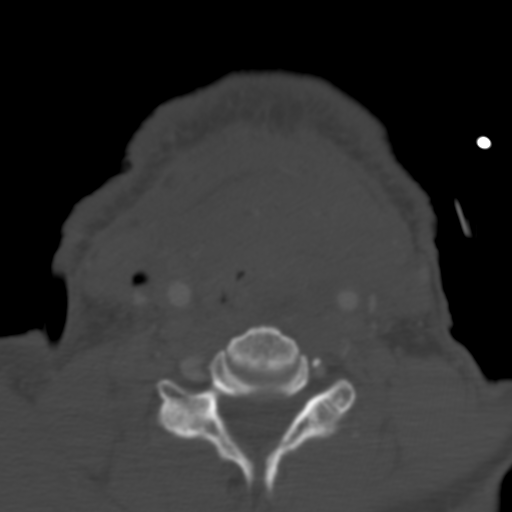
[im 81/121  bone]
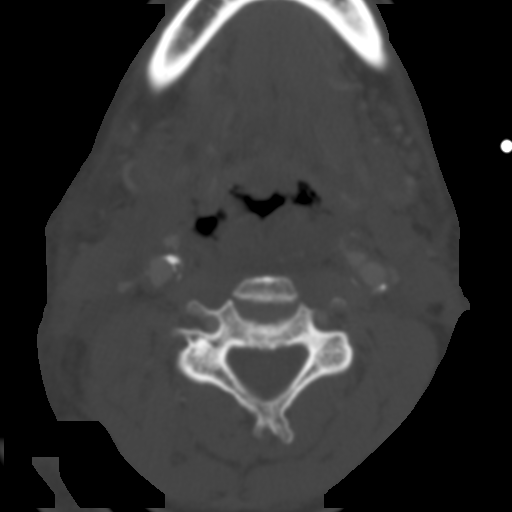
[im 94/121  bone]
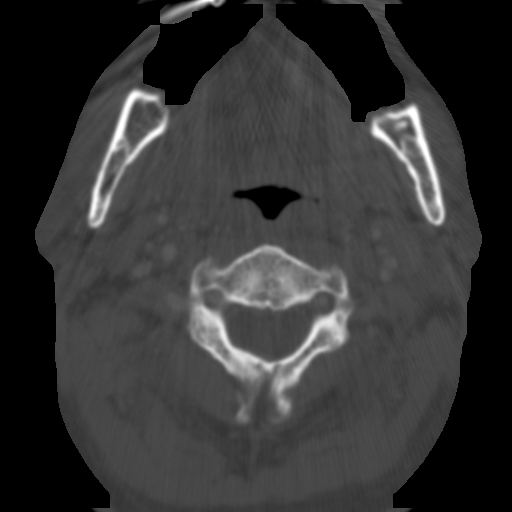
[im 107/121  bone]
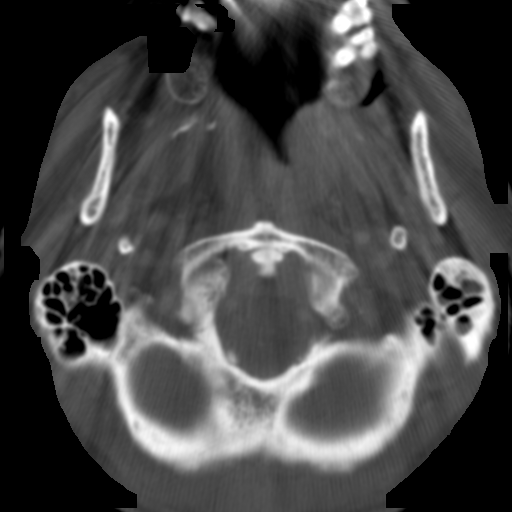

[sagittal · sagittal · 0.47mm/px · 5 of 70 slices shown, 6 images]
[im 24/70  bone]
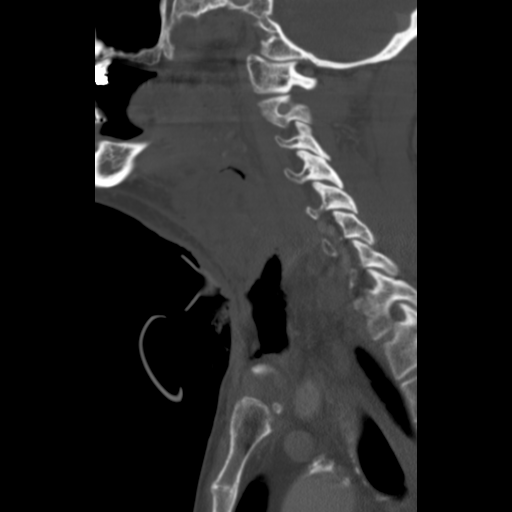
[im 29/70  bone]
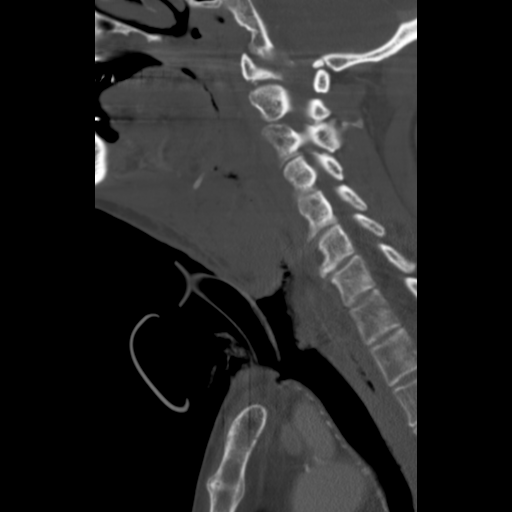
[im 35/70  soft-tissue]
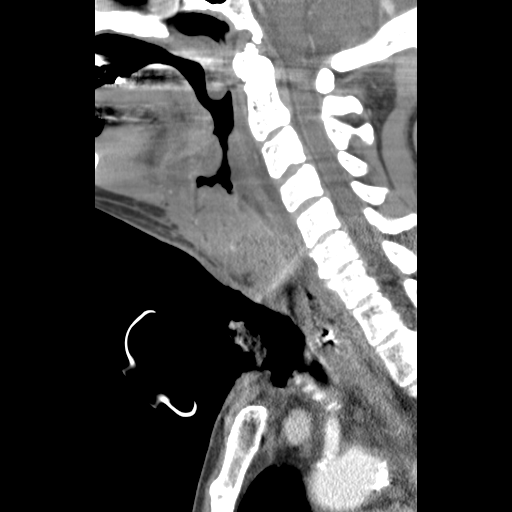
[im 35/70  bone]
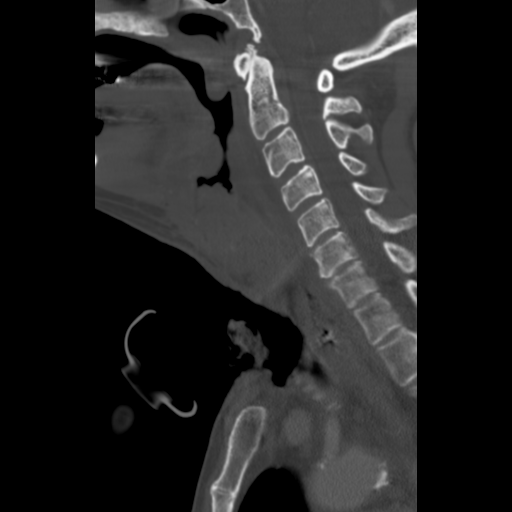
[im 41/70  bone]
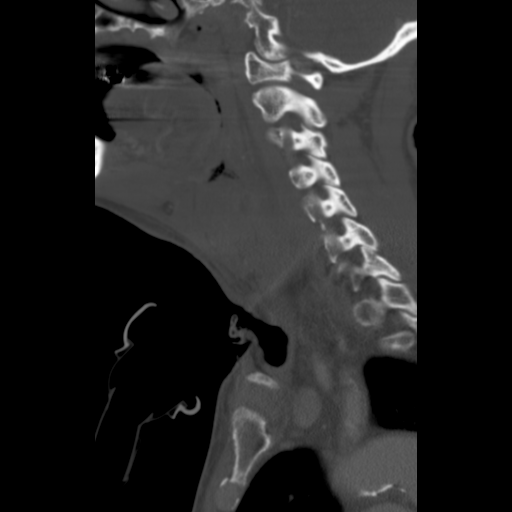
[im 47/70  bone]
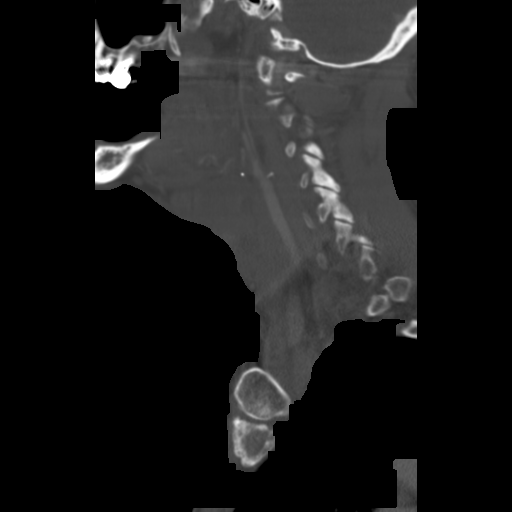

[coronals · coronal · 0.47mm/px · 3 of 67 slices shown]
[im 16/67  bone]
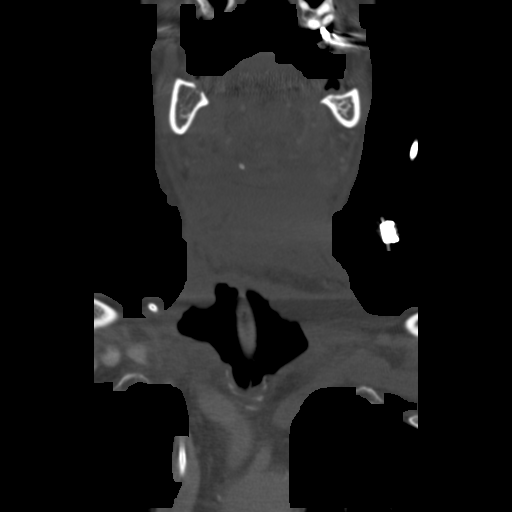
[im 28/67  bone]
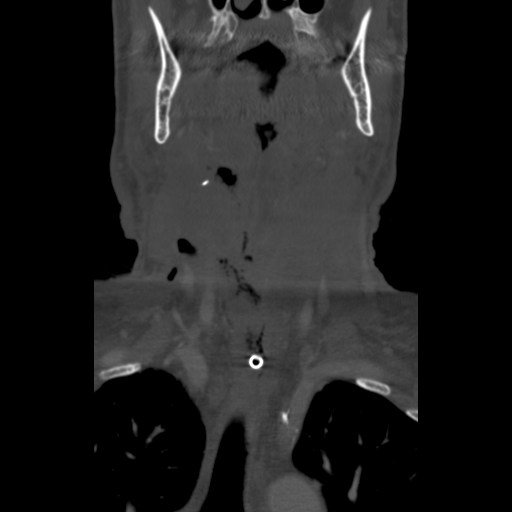
[im 40/67  bone]
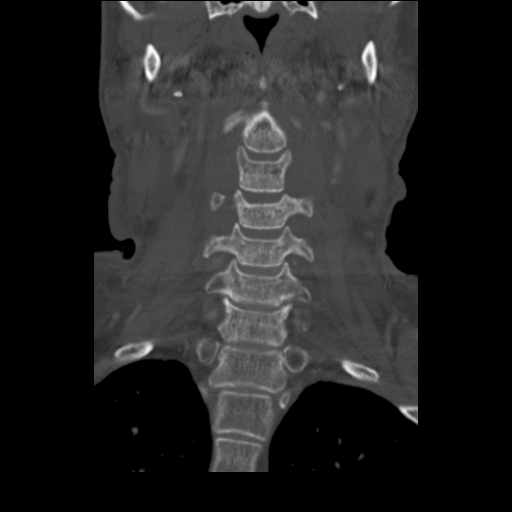

[16 of 33 positions shown; findings below may reference images not displayed]

FINDINGS: A tracheostomy has been performed.  It is widely patent.
A nasal trumpet is in the stoma anteriorly.  There is mild
surrounding subcutaneous air which extends into the prevertebral
soft tissues. The patient appears to have undergone total
laryngectomy.  There is no visible laryngeal skeleton remaining.
The hyoid bone is present.

There is a large soft tissue mass in the infrahyoid space to the
left of midline measuring 70 x 43 x 67 mm (image 58 series 2 for
instance). It is hard to imagine that this represents tumor given
that there has been laryngectomy.  This is more likely to represent
a hematoma or phlegmon.  Mild surrounding cellulitic change.  No
central liquefaction to definitely suggest abscess.  If there is
concern for infected hematoma, tissue sampling could be performed
at any of multiple levels.
IMPRESSION: Large soft tissue mass to the left of midline in the infrahyoid
space measuring 70 x 43 x 67 mm.  In this patient with tracheostomy
and total laryngectomy, this likely represents a hematoma or
phlegmon.  Residual tumor seems unlikely.  No central liquefaction
to suggest abscess.

Tracheostomy with nasal trumpet in the stoma.  No evidence for
airway obstruction.
